# Patient Record
Sex: Male | Born: 1960
Health system: Southern US, Community
[De-identification: ages and names within clinical notes are randomized; demographics above are authoritative.]

## PROBLEM LIST (undated history)

## (undated) DIAGNOSIS — K649 Unspecified hemorrhoids: Secondary | ICD-10-CM

## (undated) DIAGNOSIS — J302 Other seasonal allergic rhinitis: Secondary | ICD-10-CM

## (undated) DIAGNOSIS — C61 Malignant neoplasm of prostate: Secondary | ICD-10-CM

## (undated) DIAGNOSIS — M1990 Unspecified osteoarthritis, unspecified site: Secondary | ICD-10-CM

## (undated) DIAGNOSIS — Z8719 Personal history of other diseases of the digestive system: Secondary | ICD-10-CM

## (undated) DIAGNOSIS — Z8619 Personal history of other infectious and parasitic diseases: Secondary | ICD-10-CM

## (undated) DIAGNOSIS — R972 Elevated prostate specific antigen [PSA]: Secondary | ICD-10-CM

## (undated) DIAGNOSIS — K219 Gastro-esophageal reflux disease without esophagitis: Secondary | ICD-10-CM

## (undated) DIAGNOSIS — B1920 Unspecified viral hepatitis C without hepatic coma: Secondary | ICD-10-CM

## (undated) HISTORY — PX: PROSTATE BIOPSY: SHX241

## (undated) HISTORY — PX: BACK SURGERY: SHX140

## (undated) HISTORY — DX: Elevated prostate specific antigen (PSA): R97.20

## (undated) HISTORY — DX: Unspecified viral hepatitis C without hepatic coma: B19.20

## (undated) HISTORY — PX: FOOT SURGERY: SHX648

---

## 1999-02-17 ENCOUNTER — Ambulatory Visit (HOSPITAL_COMMUNITY): Admission: RE | Admit: 1999-02-17 | Discharge: 1999-02-17 | Payer: Self-pay | Admitting: Gastroenterology

## 1999-10-26 ENCOUNTER — Ambulatory Visit (HOSPITAL_COMMUNITY): Admission: RE | Admit: 1999-10-26 | Discharge: 1999-10-26 | Payer: Self-pay | Admitting: Gastroenterology

## 2000-11-25 ENCOUNTER — Ambulatory Visit (HOSPITAL_COMMUNITY): Admission: RE | Admit: 2000-11-25 | Discharge: 2000-11-25 | Payer: Self-pay

## 2017-09-09 DIAGNOSIS — M25569 Pain in unspecified knee: Secondary | ICD-10-CM | POA: Diagnosis not present

## 2017-09-09 DIAGNOSIS — R1032 Left lower quadrant pain: Secondary | ICD-10-CM | POA: Diagnosis not present

## 2017-09-09 DIAGNOSIS — Z125 Encounter for screening for malignant neoplasm of prostate: Secondary | ICD-10-CM | POA: Diagnosis not present

## 2017-09-09 DIAGNOSIS — Z1331 Encounter for screening for depression: Secondary | ICD-10-CM | POA: Diagnosis not present

## 2017-09-09 DIAGNOSIS — M545 Low back pain: Secondary | ICD-10-CM | POA: Diagnosis not present

## 2017-09-09 DIAGNOSIS — R102 Pelvic and perineal pain: Secondary | ICD-10-CM | POA: Diagnosis not present

## 2017-09-09 DIAGNOSIS — M25561 Pain in right knee: Secondary | ICD-10-CM | POA: Diagnosis not present

## 2017-09-09 DIAGNOSIS — M25562 Pain in left knee: Secondary | ICD-10-CM | POA: Diagnosis not present

## 2017-09-19 ENCOUNTER — Encounter: Payer: Self-pay | Admitting: Gastroenterology

## 2017-09-19 ENCOUNTER — Other Ambulatory Visit: Payer: Self-pay

## 2017-09-23 ENCOUNTER — Ambulatory Visit (INDEPENDENT_AMBULATORY_CARE_PROVIDER_SITE_OTHER): Payer: BLUE CROSS/BLUE SHIELD | Admitting: Gastroenterology

## 2017-09-23 ENCOUNTER — Encounter: Payer: Self-pay | Admitting: Gastroenterology

## 2017-09-23 VITALS — BP 112/70 | HR 78 | Ht 72.0 in | Wt 208.4 lb

## 2017-09-23 DIAGNOSIS — R1032 Left lower quadrant pain: Secondary | ICD-10-CM | POA: Diagnosis not present

## 2017-09-23 DIAGNOSIS — K921 Melena: Secondary | ICD-10-CM | POA: Diagnosis not present

## 2017-09-23 DIAGNOSIS — R634 Abnormal weight loss: Secondary | ICD-10-CM

## 2017-09-23 MED ORDER — GLYCOPYRROLATE 2 MG PO TABS: 2.0000 mg | ORAL_TABLET | Freq: Two times a day (BID) | ORAL | 2 refills | Status: DC

## 2017-09-23 MED ORDER — NA SULFATE-K SULFATE-MG SULF 17.5-3.13-1.6 GM/177ML PO SOLN: 1.0000 | Freq: Once | ORAL | 0 refills | Status: AC

## 2017-09-23 NOTE — Progress Notes (Signed)
History of Present Illness: This is a 57 year old male referred by Charlott Holler, FNP for the evaluation of LLQ pain, L groin pain, hematochezia, thinner stools, weight loss.  Patient relates he underwent colonoscopy by Dr. Earlean Shawl about 20 years ago and polyps were removed.  He recalls a 5-year interval colonoscopy was recommended, however, he has not had a repeat colonoscopy.  Patient also states he was treated for HCV with interferon and ribavirin by Dr. Earlean Shawl about 20 years ago in a study protocol.  For several months he has had mild left lower quadrant pain associated with bowel movements.  The pain is now occurring in his left groin and left lower quadrant and is there constantly it worsens with twisting.  It does not appear to change with bowel movements.  He has noted thinner stools and occasional small amounts of bright red blood per rectum.  He relates a history of hemorrhoids.  He notes about an 8 pound weight loss over the past few weeks.  In addition he has acid reflux which is currently diet controlled.  CBC, CMP, UA from his PCP earlier this month revealed a low platelet count at 76k, an elevated glucose at 122 and otherwise unremarkable.  At the end of the office evaluation he requested a refill of oxycodone.  Denies constipation, diarrhea, melena, nausea, vomiting, dysphagia, chest pain.    Allergies  Allergen Reactions  . Tylenol [Acetaminophen]    Outpatient Medications Prior to Visit  Medication Sig Dispense Refill  . oxycodone (OXY-IR) 5 MG capsule Take 5 mg by mouth 3 (three) times daily as needed.     No facility-administered medications prior to visit.    Past Medical History:  Diagnosis Date  . Hepatitis C         Elevated PSA  Social History   Socioeconomic History  . Marital status: Married    Spouse name: Not on file  . Number of children: Not on file  . Years of education: Not on file  . Highest education level: Not on file  Occupational History  . Not on  file  Social Needs  . Financial resource strain: Not on file  . Food insecurity:    Worry: Not on file    Inability: Not on file  . Transportation needs:    Medical: Not on file    Non-medical: Not on file  Tobacco Use  . Smoking status: Former Research scientist (life sciences)  . Smokeless tobacco: Never Used  Substance and Sexual Activity  . Alcohol use: Not on file  . Drug use: Never  . Sexual activity: Not on file  Lifestyle  . Physical activity:    Days per week: Not on file    Minutes per session: Not on file  . Stress: Not on file  Relationships  . Social connections:    Talks on phone: Not on file    Gets together: Not on file    Attends religious service: Not on file    Active member of club or organization: Not on file    Attends meetings of clubs or organizations: Not on file    Relationship status: Not on file  Other Topics Concern  . Not on file  Social History Narrative  . Not on file   No family history on file.     Review of Systems: Pertinent positive and negative review of systems were noted in the above HPI section. All other review of systems were otherwise negative.  Physical Exam: General: Well developed, well nourished, no acute distress Head: Normocephalic and atraumatic Eyes:  sclerae anicteric, EOMI Ears: Normal auditory acuity Mouth: No deformity or lesions Neck: Supple, no masses or thyromegaly Lungs: Clear throughout to auscultation Heart: Regular rate and rhythm; no murmurs, rubs or bruits Abdomen: Soft, mild tenderness across his lower abdomen, left > right, and non distended. No masses, hepatosplenomegaly or hernias noted. Normal Bowel sounds Rectal: deferred to colonoscopy Musculoskeletal: Symmetrical with no gross deformities  Skin: No lesions on visible extremities Pulses:  Normal pulses noted Extremities: No clubbing, cyanosis, edema or deformities noted Neurological: Alert oriented x 4, grossly nonfocal Cervical Nodes:  No significant cervical  adenopathy Inguinal Nodes: No significant inguinal adenopathy Psychological:  Alert and cooperative. Normal mood and affect   Assessment and Recommendations:  1.  Left lower quadrant pain, left groin pain.  Initially LLQ pain was associated with bowel movements now it is present all the time and it worsens with twisting movements.  Rule out gastrointestinal, musculoskeletal and other than etiologies.  Schedule abdominal/pelvic CT.  Declined to refill oxycodone.  Trial of glycopyrrolate 2 mg twice daily.  2.  Personal history of colon polyps, type unknown, thinner stools, weight loss, small-volume hematochezia.  Rule out colorectal neoplasms.  Schedule colonoscopy.  The risks (including bleeding, perforation, infection, missed lesions, medication reactions and possible hospitalization or surgery if complications occur), benefits, and alternatives to colonoscopy with possible biopsy and possible polypectomy were discussed with the patient and they consent to proceed.   3.  History of hepatitis C.  Thrombocytopenia.   Rule out cirrhosis.  He relates a history of interferon and ribavirin therapy about 20 years ago. Check HCV RNA quant.  Abdominal/pelvic CT as above.  4. GERD.  Follow standard antireflux measures.  Prilosec 20 mg OTC daily as needed.  Tums as needed.   cc: Charlott Holler, MD

## 2017-09-23 NOTE — Patient Instructions (Signed)
Your provider has requested that you go to the basement level for lab work before leaving today. Press "B" on the elevator. The lab is located at the first door on the left as you exit the elevator.  We have sent the following medications to your pharmacy for you to pick up at your convenience:glycopyrrolate.  You have been scheduled for a CT scan of the abdomen and pelvis at [facility] ([address]---this is in the same building as [facility]).   You are scheduled on 10/07/17 at 3:30pm. You should arrive 15 minutes prior to your appointment time for registration. Please follow the written instructions below on the day of your exam:  WARNING: IF YOU ARE ALLERGIC TO IODINE/X-RAY DYE, PLEASE NOTIFY RADIOLOGY IMMEDIATELY AT [phone]! YOU WILL BE GIVEN A 13 HOUR PREMEDICATION PREP.  1) Do not eat or drink anything after 11:30am (4 hours prior to your test) 2) You have been given 2 bottles of oral contrast to drink. The solution may taste better if refrigerated, but do NOT add ice or any other liquid to this solution. Shake well before drinking.    Drink 1 bottle of contrast @ 1:30pm (2 hours prior to your exam)  Drink 1 bottle of contrast @ 2:30pm (1 hour prior to your exam)  You may take any medications as prescribed with a small amount of water except for the following: Metformin, Glucophage, Glucovance, Avandamet, Riomet, Fortamet, Actoplus Met, Janumet, Glumetza or Metaglip. The above medications must be held the day of the exam AND 48 hours after the exam.  The purpose of you drinking the oral contrast is to aid in the visualization of your intestinal tract. The contrast solution may cause some diarrhea. Before your exam is started, you will be given a small amount of fluid to drink. Depending on your individual set of symptoms, you may also receive an intravenous injection of x-ray contrast/dye. Plan on being at [facility] for 30 minutes or longer, depending on  the type of exam you are having performed.  This test typically takes 30-45 minutes to complete.  If you have any questions regarding your exam or if you need to reschedule, you may call the CT department at [phone] between the hours of 8:00 am and 5:00 pm, Monday-Friday.  ________________________________________________________________________  Dennis Bast have been scheduled for a colonoscopy. Please follow written instructions given to you at your visit today.  Please pick up your prep supplies at the pharmacy within the next 1-3 days. If you use inhalers (even only as needed), please bring them with you on the day of your procedure. Your physician has requested that you go to www.startemmi.com and enter the access code given to you at your visit today. This web site gives a general overview about your procedure. However, you should still follow specific instructions given to you by our office regarding your preparation for the procedure.  Thank you for choosing me and [facility].  Pricilla Riffle. Dagoberto Ligas., MD., Marval Regal

## 2017-09-27 ENCOUNTER — Telehealth: Payer: Self-pay

## 2017-09-27 NOTE — Telephone Encounter (Signed)
-----   Message from Darden Dates sent at 09/27/2017 10:17 AM EDT ----- Regarding: Phone call Ileene Musa, I wanted to get a msg to you, probably needs to get to Dr. Fuller Plan.  See below:  Pt called and was very hateful over the telephone returning a call from Friday when he requested benefit information. He said he was sent to [place] for a colonoscopy for preventive services and was told his procedure would be covered. He said he can't afford procedure and said he was told it would be covered. I told him that I had spoke to his insurance directly and gave them all codes and they confirmed that the codes would not fall under preventative services. Again, he said he could not afford it.  I told him he had to be symptom free in order for a colonoscopy to be covered. Then he said that's not what he was told and that he would find another doctor and hung up on me. He called back and again was very hateful. He said he should not have to pay for procedure and that we are "just wanting him to have a bill". I told him that his records would be sent with his claim, and his insurance would know he was having problems.  I also told him that his CT that was scheduled would be a diagnostic service as well.  He said he was told that would be covered too.  I told him that he should call the imaging center to cancel if he wasn't going to keep apt.  He said "you can call to cancel that and the procedure, and I will write a letter to complain about this, this is a disgrace." I told him that I would make a note and have apts cancelled, and then he hung up abruptly again. Note sent to CMA.  This has been put in the referral where I put my notes for benefit information.  I have cancelled procedure and have gotten a msg to [facility] to cancel CT. Thanks, Amy

## 2017-09-27 NOTE — Telephone Encounter (Signed)
Please see Amy's note regarding patient's behavior.

## 2017-10-04 DIAGNOSIS — B182 Chronic viral hepatitis C: Secondary | ICD-10-CM | POA: Diagnosis not present

## 2017-10-04 DIAGNOSIS — Z79899 Other long term (current) drug therapy: Secondary | ICD-10-CM | POA: Diagnosis not present

## 2017-10-04 DIAGNOSIS — M545 Low back pain: Secondary | ICD-10-CM | POA: Diagnosis not present

## 2017-10-04 DIAGNOSIS — R1032 Left lower quadrant pain: Secondary | ICD-10-CM | POA: Diagnosis not present

## 2017-10-06 ENCOUNTER — Encounter: Payer: BLUE CROSS/BLUE SHIELD | Admitting: Gastroenterology

## 2017-10-07 ENCOUNTER — Other Ambulatory Visit: Payer: BLUE CROSS/BLUE SHIELD

## 2017-10-13 DIAGNOSIS — Z1211 Encounter for screening for malignant neoplasm of colon: Secondary | ICD-10-CM | POA: Diagnosis not present

## 2017-10-13 DIAGNOSIS — K648 Other hemorrhoids: Secondary | ICD-10-CM | POA: Insufficient documentation

## 2017-10-26 DIAGNOSIS — K648 Other hemorrhoids: Secondary | ICD-10-CM | POA: Diagnosis not present

## 2017-11-09 DIAGNOSIS — K648 Other hemorrhoids: Secondary | ICD-10-CM | POA: Diagnosis not present

## 2017-11-30 DIAGNOSIS — K648 Other hemorrhoids: Secondary | ICD-10-CM | POA: Diagnosis not present

## 2017-11-30 DIAGNOSIS — R1310 Dysphagia, unspecified: Secondary | ICD-10-CM | POA: Diagnosis not present

## 2017-12-13 DIAGNOSIS — K2960 Other gastritis without bleeding: Secondary | ICD-10-CM | POA: Diagnosis not present

## 2017-12-13 DIAGNOSIS — R1310 Dysphagia, unspecified: Secondary | ICD-10-CM | POA: Diagnosis not present

## 2017-12-13 DIAGNOSIS — R1314 Dysphagia, pharyngoesophageal phase: Secondary | ICD-10-CM | POA: Diagnosis not present

## 2017-12-13 DIAGNOSIS — K2970 Gastritis, unspecified, without bleeding: Secondary | ICD-10-CM | POA: Diagnosis not present

## 2018-01-05 DIAGNOSIS — Z79899 Other long term (current) drug therapy: Secondary | ICD-10-CM | POA: Diagnosis not present

## 2018-01-05 DIAGNOSIS — Z6830 Body mass index (BMI) 30.0-30.9, adult: Secondary | ICD-10-CM | POA: Diagnosis not present

## 2018-01-05 DIAGNOSIS — M545 Low back pain: Secondary | ICD-10-CM | POA: Diagnosis not present

## 2018-02-23 DIAGNOSIS — Z6829 Body mass index (BMI) 29.0-29.9, adult: Secondary | ICD-10-CM | POA: Diagnosis not present

## 2018-02-23 DIAGNOSIS — Z23 Encounter for immunization: Secondary | ICD-10-CM | POA: Diagnosis not present

## 2018-02-23 DIAGNOSIS — R062 Wheezing: Secondary | ICD-10-CM | POA: Diagnosis not present

## 2018-02-23 DIAGNOSIS — Z8619 Personal history of other infectious and parasitic diseases: Secondary | ICD-10-CM | POA: Diagnosis not present

## 2018-02-23 DIAGNOSIS — J209 Acute bronchitis, unspecified: Secondary | ICD-10-CM | POA: Diagnosis not present

## 2018-02-23 DIAGNOSIS — Z0000 Encounter for general adult medical examination without abnormal findings: Secondary | ICD-10-CM | POA: Diagnosis not present

## 2018-02-23 DIAGNOSIS — R0602 Shortness of breath: Secondary | ICD-10-CM | POA: Diagnosis not present

## 2018-03-27 DIAGNOSIS — Z6830 Body mass index (BMI) 30.0-30.9, adult: Secondary | ICD-10-CM | POA: Diagnosis not present

## 2018-03-27 DIAGNOSIS — J209 Acute bronchitis, unspecified: Secondary | ICD-10-CM | POA: Diagnosis not present

## 2018-04-05 DIAGNOSIS — R972 Elevated prostate specific antigen [PSA]: Secondary | ICD-10-CM | POA: Diagnosis not present

## 2018-04-24 DIAGNOSIS — Z6829 Body mass index (BMI) 29.0-29.9, adult: Secondary | ICD-10-CM | POA: Diagnosis not present

## 2018-04-24 DIAGNOSIS — Z79899 Other long term (current) drug therapy: Secondary | ICD-10-CM | POA: Diagnosis not present

## 2018-04-24 DIAGNOSIS — M545 Low back pain: Secondary | ICD-10-CM | POA: Diagnosis not present

## 2018-05-25 DIAGNOSIS — R972 Elevated prostate specific antigen [PSA]: Secondary | ICD-10-CM | POA: Diagnosis not present

## 2018-05-25 DIAGNOSIS — N401 Enlarged prostate with lower urinary tract symptoms: Secondary | ICD-10-CM | POA: Diagnosis not present

## 2018-05-25 DIAGNOSIS — R351 Nocturia: Secondary | ICD-10-CM | POA: Diagnosis not present

## 2018-05-29 DIAGNOSIS — M1712 Unilateral primary osteoarthritis, left knee: Secondary | ICD-10-CM | POA: Diagnosis not present

## 2018-05-29 DIAGNOSIS — M545 Low back pain: Secondary | ICD-10-CM | POA: Diagnosis not present

## 2018-05-29 DIAGNOSIS — Z6830 Body mass index (BMI) 30.0-30.9, adult: Secondary | ICD-10-CM | POA: Diagnosis not present

## 2018-06-30 DIAGNOSIS — Z6830 Body mass index (BMI) 30.0-30.9, adult: Secondary | ICD-10-CM | POA: Diagnosis not present

## 2018-06-30 DIAGNOSIS — M545 Low back pain: Secondary | ICD-10-CM | POA: Diagnosis not present

## 2018-06-30 DIAGNOSIS — M1711 Unilateral primary osteoarthritis, right knee: Secondary | ICD-10-CM | POA: Diagnosis not present

## 2018-07-20 DIAGNOSIS — N401 Enlarged prostate with lower urinary tract symptoms: Secondary | ICD-10-CM | POA: Diagnosis not present

## 2018-07-20 DIAGNOSIS — R351 Nocturia: Secondary | ICD-10-CM | POA: Diagnosis not present

## 2018-07-20 DIAGNOSIS — C61 Malignant neoplasm of prostate: Secondary | ICD-10-CM | POA: Diagnosis not present

## 2018-07-20 DIAGNOSIS — R972 Elevated prostate specific antigen [PSA]: Secondary | ICD-10-CM | POA: Diagnosis not present

## 2018-08-02 ENCOUNTER — Other Ambulatory Visit: Payer: Self-pay

## 2018-09-06 ENCOUNTER — Encounter: Payer: Self-pay | Admitting: Radiation Oncology

## 2018-09-06 NOTE — Progress Notes (Signed)
GU Location of Tumor / Histology: prostatic adenocarcinoma  If Prostate Cancer, Gleason Score is (3 + 4) and PSA is (6.1) on 04/2018. Prostate volume: 26 g.  Patrick Bauer was referred by Charlott Holler, NP to Dr. Jeffie Pollock for further evaluation of an elevated PSA and LUTS.  Biopsies of prostate (if applicable) revealed:    Past/Anticipated interventions by urology, if any: prostate biopsy, referral for consideration of radiation therapy  Past/Anticipated interventions by medical oncology, if any: no  Weight changes, if any: No  Bowel/Bladder complaints, if any: Reports urinary urgency, hesitancy, variable stream, nocturia every hour, sensation of not emptying his bladder completely, and rare dysuria. Denies hematuria.    Nausea/Vomiting, if any: No  Pain issues, if any:  No  SAFETY ISSUES:  Prior radiation? No  Pacemaker/ICD? No  Possible current pregnancy? no, male patient  Is the patient on methotrexate? No  Current Complaints / other details:  58 year old male. Married with five children. Consumes a gallon of fluids per day plus "a lot of coffee." Father with hx of prostate ca.

## 2018-09-08 ENCOUNTER — Encounter: Payer: Self-pay | Admitting: Radiation Oncology

## 2018-09-08 ENCOUNTER — Ambulatory Visit
Admission: RE | Admit: 2018-09-08 | Discharge: 2018-09-08 | Disposition: A | Discharge status: Home / Self Care | Payer: BLUE CROSS/BLUE SHIELD | Source: Ambulatory Visit | Attending: Radiation Oncology | Admitting: Radiation Oncology

## 2018-09-08 ENCOUNTER — Other Ambulatory Visit: Payer: Self-pay

## 2018-09-08 VITALS — Ht 72.0 in | Wt 215.0 lb

## 2018-09-08 DIAGNOSIS — C61 Malignant neoplasm of prostate: Secondary | ICD-10-CM

## 2018-09-08 DIAGNOSIS — R972 Elevated prostate specific antigen [PSA]: Secondary | ICD-10-CM | POA: Diagnosis not present

## 2018-09-08 HISTORY — DX: Malignant neoplasm of prostate: C61

## 2018-09-08 NOTE — Progress Notes (Signed)
Radiation Oncology         [phone] ________________________________  Initial Outpatient Consultation - Conducted via telephone due to current COVID-19 concerns for limiting patient exposure  Name: Patrick Bauer MRN: [id number]  Date: 09/08/2018  DOB: 08/30/1960  CC:Lam, Patrick Rummage, NP  Patrick Seal, MD   REFERRING PHYSICIAN: Irine Seal, MD  DIAGNOSIS: 58 y.o. gentleman with Stage T1c adenocarcinoma of the prostate with Gleason score of 3+4, and PSA of 6.1.    ICD-10-CM   1. Prostate cancer (Patrick Bauer) C61     HISTORY OF PRESENT ILLNESS: Patrick Bauer is a 58 y.o. male with a diagnosis of prostate cancer. He was noted to have an elevated PSA of 6.1 in 04/2018 by his primary care physician, Patrick Holler, NP.  Previous PSA had been elevated at 4.4 in 08/2017 and 6.0 in 01/2018.  Accordingly, he was referred for evaluation in urology by Dr. Jeffie Bauer on 05/25/2018,  digital rectal examination was performed at that time revealing no nodules.  The patient proceeded to transrectal ultrasound with 12 biopsies of the prostate on 07/20/2018.  The prostate volume measured 26 cc.  Out of 12 core biopsies, 3 were positive.  The maximum Gleason score was 3+4, and this was seen in right mid and right apex lateral. Gleason 3+3 was seen in right apex.  The patient reviewed the biopsy results with his urologist and he has kindly been referred today for discussion of potential radiation treatment options.  PREVIOUS RADIATION THERAPY: No  PAST MEDICAL HISTORY:  Past Medical History:  Diagnosis Date  . Elevated PSA   . Hepatitis C   . Prostate cancer ([place])       PAST SURGICAL HISTORY: Past Surgical History:  Procedure Laterality Date  . BACK SURGERY     tumor removed  . FOOT SURGERY Right   . PROSTATE BIOPSY      FAMILY HISTORY:  Family History  Problem Relation Age of Onset  . Non-Hodgkin's lymphoma Father   . Prostate cancer Father     SOCIAL HISTORY:  Social History   Socioeconomic History  .  Marital status: Married    Spouse name: Not on file  . Number of children: 5  . Years of education: Not on file  . Highest education level: Not on file  Occupational History  . Occupation: Furniture conservator/restorer  Social Needs  . Financial resource strain: Not on file  . Food insecurity:    Worry: Not on file    Inability: Not on file  . Transportation needs:    Medical: No    Non-medical: No  Tobacco Use  . Smoking status: Former Smoker    Last attempt to quit: 09/24/1987    Years since quitting: 30.9  . Smokeless tobacco: Never Used  Substance and Sexual Activity  . Alcohol use: Never    Frequency: Never  . Drug use: Never  . Sexual activity: Yes    Partners: Female  Lifestyle  . Physical activity:    Days per week: Not on file    Minutes per session: Not on file  . Stress: Not on file  Relationships  . Social connections:    Talks on phone: Not on file    Gets together: Not on file    Attends religious service: Not on file    Active member of club or organization: Not on file    Attends meetings of clubs or organizations: Not on file    Relationship status: Not on file  .  Intimate partner violence:    Fear of current or ex partner: Not on file    Emotionally abused: Not on file    Physically abused: Not on file    Forced sexual activity: Not on file  Other Topics Concern  . Not on file  Social History Narrative  . Not on file    ALLERGIES: Bee venom and Tylenol [acetaminophen]  MEDICATIONS:  Current Outpatient Medications  Medication Sig Dispense Refill  . Multiple Vitamins-Minerals (MULTIVITAMIN ADULT PO) Take by mouth.    . oxyCODONE (OXY IR/ROXICODONE) 5 MG immediate release tablet TAKE 1 TABLET BY MOUTH 3 TIMES DAILY AS NEEDED FOR BACK PAIN    . Saw Palmetto 1000 MG CAPS Take by mouth.    Marland Kitchen glycopyrrolate (ROBINUL) 2 MG tablet Take 1 tablet (2 mg total) by mouth 2 (two) times daily. (Patient not taking: Reported on 09/08/2018) 60 tablet 2   No current  facility-administered medications for this encounter.     REVIEW OF SYSTEMS:  On review of systems, the patient reports that he is doing well overall. He has increased daytime frequency, urgency and nocturia 4-5x/night which he attributes to the high volume of fluids that he drinks throughout the day and right up to bed time.  He reports drinking at least 1 pot of coffee daily.  He denies weak stream, straining to void, dysuria, gross hematuria or feelings of incomplete bladder emptying.  He denies any chest pain, shortness of breath, cough, fevers, chills, night sweats, unintended weight changes. He denies any bowel disturbances, and denies abdominal pain, nausea or vomiting. He denies any new musculoskeletal or joint aches or pains. He reports urinary urgency, hesitancy, variable stream, nocturia every hour, the sensation of not emptying his bladder completely, and rare dysuria. He denies hematuria. A complete review of systems is obtained and is otherwise negative.    PHYSICAL EXAM:  Wt Readings from Last 3 Encounters:  09/08/18 215 lb (97.5 kg)  09/23/17 208 lb 6 oz (94.5 kg)   Temp Readings from Last 3 Encounters:  No data found for Temp   BP Readings from Last 3 Encounters:  09/23/17 112/70   Pulse Readings from Last 3 Encounters:  09/23/17 78   Unable to assess due to telephone consult encounter format.  KPS = 80  100 - Normal; no complaints; no evidence of disease. 90   - Able to carry on normal activity; minor signs or symptoms of disease. 80   - Normal activity with effort; some signs or symptoms of disease. 62   - Cares for self; unable to carry on normal activity or to do active work. 60   - Requires occasional assistance, but is able to care for most of his personal needs. 50   - Requires considerable assistance and frequent medical care. 17   - Disabled; requires special care and assistance. 7   - Severely disabled; hospital admission is indicated although death not  imminent. 21   - Very sick; hospital admission necessary; active supportive treatment necessary. 10   - Moribund; fatal processes progressing rapidly. 0     - Dead  Karnofsky DA, Abelmann WH, Craver LS and Burchenal JH [phone]) The use of the nitrogen mustards in the palliative treatment of carcinoma: with particular reference to bronchogenic carcinoma Cancer 1 634-56  LABORATORY DATA:  No results found for: WBC, HGB, HCT, MCV, PLT No results found for: NA, K, CL, CO2 No results found for: ALT, AST, GGT, ALKPHOS, BILITOT  RADIOGRAPHY: No results found.    IMPRESSION/PLAN:  This visit was conducted via telephone to spare the patient unnecessary potential exposure in the healthcare setting during the current COVID-19 pandemic.  1. 58 y.o. gentleman with Stage T1c adenocarcinoma of the prostate with Gleason Score of 3+4, and PSA of 6.1. We discussed the patient's workup and outlined the nature of prostate cancer in this setting. The patient's T stage, Gleason's score, and PSA put him into the favorable intermediate risk group. Accordingly, he is eligible for a variety of potential treatment options including brachytherapy, 5.5 weeks of external radiation or prostatectomy. We discussed the available radiation techniques, and focused on the details and logistics and delivery. We discussed and outlined the risks, benefits, short and long-term effects associated with radiotherapy and compared and contrasted these with prostatectomy. We discussed the role of SpaceOAR in reducing the rectal toxicity associated with radiotherapy. He was encouraged to ask questions that were answered to his stated satisfaction.  At the end of the conversation the patient is interested in moving forward with brachytherapy and use of SpaceOAR to reduce rectal toxicity from radiotherapy.  We will strongly encourage the patient to reduce his volume of fluid intake, namely caffiene following the seed implant to help reduce the  bladder irritation to a manageable level.  We will share our discussion with Dr. Jeffie Bauer and move forward with scheduling his CT [facility] planning appointment in the near future.  The patient will connect with Romie Jumper in our office, who will be working closely with him to coordinate OR scheduling and pre and post procedure appointments, in the near future.  We will contact the pharmaceutical rep to ensure that [place] is available at the time of procedure.  He will have a prostate MRI following his post-seed CT SIM to confirm appropriate distribution of the [place].  Given current concerns for patient exposure during the COVID-19 pandemic, this encounter was conducted via telephone. The patient was notified in advance and was offered a [facility] meeting to allow for face to face communication but unfortunately reported that he did not have the appropriate resources/technology to support such a visit and instead preferred to proceed with telephone consult. The patient has given verbal consent for this type of encounter. The time spent during this encounter was 45 minutes. The attendants for this meeting include Tyler Pita MD, Ashlyn Bruning PA-C, [facility], and patient Jacolby Risby. During the encounter, Tyler Pita MD, Ashlyn Bruning PA-C, and scribe, Wilburn Mylar were located at [facility].  Patient, Ervin Hensley was located at home.    Nicholos Johns, PA-C    Tyler Pita, MD  [facility] Direct Dial: [phone]  Fax: [phone] [email or web address]  Skype  [email or web address]   This document serves as a record of services personally performed by Tyler Pita, MD and Freeman Caldron, PA-C. It was created on their behalf by Wilburn Mylar, a trained medical scribe. The creation of this record is based on the scribe's personal observations and the provider's statements to them. This document has  been checked and approved by the attending provider.

## 2018-09-10 DIAGNOSIS — C61 Malignant neoplasm of prostate: Secondary | ICD-10-CM | POA: Insufficient documentation

## 2018-09-27 ENCOUNTER — Other Ambulatory Visit: Payer: Self-pay | Admitting: Urology

## 2018-09-28 DIAGNOSIS — Z79899 Other long term (current) drug therapy: Secondary | ICD-10-CM | POA: Diagnosis not present

## 2018-09-28 DIAGNOSIS — C61 Malignant neoplasm of prostate: Secondary | ICD-10-CM | POA: Diagnosis not present

## 2018-09-28 DIAGNOSIS — M545 Low back pain: Secondary | ICD-10-CM | POA: Diagnosis not present

## 2018-09-28 DIAGNOSIS — Z6829 Body mass index (BMI) 29.0-29.9, adult: Secondary | ICD-10-CM | POA: Diagnosis not present

## 2018-11-22 ENCOUNTER — Telehealth: Payer: Self-pay | Admitting: *Deleted

## 2018-11-22 NOTE — Telephone Encounter (Signed)
CALLED PATIENT TO REMIND OF PRE-SEED APPTS. FOR 11-23-18, SPOKE WITH PATIENT AND HE IS AWARE OF THESE APPTS. 

## 2018-11-23 ENCOUNTER — Other Ambulatory Visit: Payer: Self-pay

## 2018-11-23 ENCOUNTER — Ambulatory Visit
Admission: RE | Admit: 2018-11-23 | Discharge: 2018-11-23 | Disposition: A | Discharge status: Home / Self Care | Payer: BC Managed Care – PPO | Source: Ambulatory Visit | Attending: Urology | Admitting: Urology

## 2018-11-23 ENCOUNTER — Ambulatory Visit (HOSPITAL_COMMUNITY)
Admission: RE | Admit: 2018-11-23 | Discharge: 2018-11-23 | Disposition: A | Discharge status: Home / Self Care | Payer: BC Managed Care – PPO | Source: Ambulatory Visit | Attending: Urology | Admitting: Urology

## 2018-11-23 ENCOUNTER — Encounter (HOSPITAL_COMMUNITY)
Admission: RE | Admit: 2018-11-23 | Discharge: 2018-11-23 | Disposition: A | Discharge status: Home / Self Care | Payer: BC Managed Care – PPO | Source: Ambulatory Visit | Attending: Urology | Admitting: Urology

## 2018-11-23 ENCOUNTER — Ambulatory Visit
Admission: RE | Admit: 2018-11-23 | Discharge: 2018-11-23 | Disposition: A | Discharge status: Home / Self Care | Payer: BC Managed Care – PPO | Source: Ambulatory Visit | Attending: Radiation Oncology | Admitting: Radiation Oncology

## 2018-11-23 DIAGNOSIS — Z01818 Encounter for other preprocedural examination: Secondary | ICD-10-CM | POA: Diagnosis not present

## 2018-11-23 DIAGNOSIS — C61 Malignant neoplasm of prostate: Secondary | ICD-10-CM | POA: Insufficient documentation

## 2018-11-23 NOTE — Progress Notes (Signed)
  Radiation Oncology         [phone]) [phone] ________________________________  Name: Zykeem Bauserman MRN: [id number]  Date: 11/23/2018  DOB: 09-20-1960  SIMULATION AND TREATMENT PLANNING NOTE PUBIC ARCH STUDY  CC:Lam, Rudi Rummage, NP  Irine Seal, MD  DIAGNOSIS: 58 y.o. gentleman with Stage T1c adenocarcinoma of the prostate with Gleason score of 3+4, and PSA of 6.1     ICD-10-CM   1. Prostate cancer ([place])  C61     COMPLEX SIMULATION:  The patient presented today for evaluation for possible prostate seed implant. He was brought to the radiation planning suite and placed supine on the CT couch. A 3-dimensional image study set was obtained in upload to the planning computer. There, on each axial slice, I contoured the prostate gland. Then, using three-dimensional radiation planning tools I reconstructed the prostate in view of the structures from the transperineal needle pathway to assess for possible pubic arch interference. In doing so, I did not appreciate any pubic arch interference. Also, the patient's prostate volume was estimated based on the drawn structure. The volume was 26 cc.  TRUS was also 26 cc.  Given the pubic arch appearance and prostate volume, patient remains a good candidate to proceed with prostate seed implant. Today, he freely provided informed written consent to proceed.   PLAN: The patient will undergo prostate seed implant.   ________________________________  Sheral Apley. Tammi Klippel, M.D.

## 2018-11-28 DIAGNOSIS — C61 Malignant neoplasm of prostate: Secondary | ICD-10-CM | POA: Diagnosis not present

## 2018-12-04 DIAGNOSIS — H5202 Hypermetropia, left eye: Secondary | ICD-10-CM | POA: Diagnosis not present

## 2018-12-11 ENCOUNTER — Telehealth: Payer: Self-pay | Admitting: *Deleted

## 2018-12-11 ENCOUNTER — Encounter (HOSPITAL_COMMUNITY)
Admission: RE | Admit: 2018-12-11 | Discharge: 2018-12-11 | Disposition: A | Discharge status: Home / Self Care | Payer: BC Managed Care – PPO | Source: Ambulatory Visit | Attending: Urology | Admitting: Urology

## 2018-12-11 ENCOUNTER — Other Ambulatory Visit: Payer: Self-pay

## 2018-12-11 ENCOUNTER — Other Ambulatory Visit (HOSPITAL_COMMUNITY)
Admission: RE | Admit: 2018-12-11 | Discharge: 2018-12-11 | Disposition: A | Discharge status: Home / Self Care | Payer: BC Managed Care – PPO | Source: Ambulatory Visit | Attending: Urology | Admitting: Urology

## 2018-12-11 DIAGNOSIS — Z01812 Encounter for preprocedural laboratory examination: Secondary | ICD-10-CM | POA: Diagnosis not present

## 2018-12-11 DIAGNOSIS — Z20828 Contact with and (suspected) exposure to other viral communicable diseases: Secondary | ICD-10-CM | POA: Diagnosis not present

## 2018-12-11 LAB — CBC
HCT: 43.2 % (ref 39.0–52.0)
Hemoglobin: 13.9 g/dL (ref 13.0–17.0)
MCH: 26.1 pg (ref 26.0–34.0)
MCHC: 32.2 g/dL (ref 30.0–36.0)
MCV: 81.2 fL (ref 80.0–100.0)
Platelets: 209 10*3/uL (ref 150–400)
RBC: 5.32 MIL/uL (ref 4.22–5.81)
RDW: 13.9 % (ref 11.5–15.5)
WBC: 6.3 10*3/uL (ref 4.0–10.5)
nRBC: 0 % (ref 0.0–0.2)

## 2018-12-11 LAB — COMPREHENSIVE METABOLIC PANEL
ALT: 11 U/L (ref 0–44)
AST: 23 U/L (ref 15–41)
Albumin: 4.1 g/dL (ref 3.5–5.0)
Alkaline Phosphatase: 62 U/L (ref 38–126)
Anion gap: 8 (ref 5–15)
BUN: 10 mg/dL (ref 6–20)
CO2: 27 mmol/L (ref 22–32)
Calcium: 9.1 mg/dL (ref 8.9–10.3)
Chloride: 101 mmol/L (ref 98–111)
Creatinine, Ser: 1.13 mg/dL (ref 0.61–1.24)
GFR calc Af Amer: >60 mL/min (ref >60)
GFR calc non Af Amer: >60 mL/min (ref >60)
Glucose, Bld: 241 mg/dL — ABNORMAL HIGH (ref 70–99)
Potassium: 4.3 mmol/L (ref 3.5–5.1)
Sodium: 136 mmol/L (ref 135–145)
Total Bilirubin: 0.7 mg/dL (ref 0.3–1.2)
Total Protein: 8.1 g/dL (ref 6.5–8.1)

## 2018-12-11 LAB — PROTIME-INR
INR: 1 (ref 0.8–1.2)
Prothrombin Time: 13.4 seconds (ref 11.4–15.2)

## 2018-12-11 LAB — SARS CORONAVIRUS 2 (TAT 6-24 HRS): SARS Coronavirus 2: NEGATIVE

## 2018-12-11 LAB — APTT: aPTT: 29 seconds (ref 24–36)

## 2018-12-11 NOTE — Telephone Encounter (Signed)
CALLED PATIENT TO REMIND OF LAB AND COVID- 19 TESTING FOR 12-11-18, VOICE MAIL FULL UNABLE TO LEAVE MESSAGE

## 2018-12-12 ENCOUNTER — Encounter: Payer: Self-pay | Admitting: *Deleted

## 2018-12-13 ENCOUNTER — Telehealth: Payer: Self-pay | Admitting: *Deleted

## 2018-12-13 ENCOUNTER — Other Ambulatory Visit: Payer: Self-pay

## 2018-12-13 ENCOUNTER — Encounter (HOSPITAL_BASED_OUTPATIENT_CLINIC_OR_DEPARTMENT_OTHER): Payer: Self-pay | Admitting: *Deleted

## 2018-12-13 NOTE — Telephone Encounter (Signed)
CALLED PATIENT TO REMIND OF PROCEDURE FOR 12-14-18, NO ANSWER WILL CALL  LATER

## 2018-12-13 NOTE — Progress Notes (Signed)
Spoke w/ pt via phone for pre-op interview.  Npo after mn w/ exception clear liquids until 0600 (no milk/ cream products) then nothing by mouth,  pt verbalized understanding.  Arrive at 1000.  Current lab results and covid test done dated 12-11-2018 in chart and epic.  Current ekg and cxr in chart and epic.  Pt verbalized understanding to do one fleet enema am dos.

## 2018-12-13 NOTE — H&P (Signed)
CC/HPI: Pt presents today for pre-operative history and physical exam in anticipation of brachytherapy and placement of space oar by Dr. Jeffie Pollock on 12/14/18.   Pt denies F/C, HA, CP, SOB, N/V, diarrhea/constipation, back pain, flank pain, hematuria, and dysuria.   HX:     CC: My PSA is elevated above the normal range.  HPI: Patrick Bauer is a 58 year-old male established patient who is here for an elevated PSA.  Patrick Bauer returns for prostate biopsy. He is a 58 yo WM who is sent by Charlott Holler NP for an elevated PSA and LUTS. His PSA was 6.1 in December and he has had 3 prior over the past year that were similar. He has a several year history of LUTS and has been taking Saw Palmetto for several years. He is on another prostate supplement. He has an IPSS of 28. He has urgency and hesitancy. He has a variable stream. He has nocturia q1hr. He drinks a lot of fluids and thinks he drinks about a gallon a day. He drinks a lot of mild. He has intermittency and a sensation of incomplete emptying. He has had rare dysuria but no hematuria. He has no history of prostatitis. He had a stone once when he was about 29. He has had no GU surgery. His father had prostate cancer in his 68's but didn't die of prostate cancer. He has mild ED with reduced tumescence.    His PSA is 6.1. He has had PSA's drawn prior to this one. He has had elevated PSA's prior to this one.     AUA Symptom Score: Almost always he has the sensation of not emptying his bladder completely when finished urinating. Almost always he has to urinate again fewer than two hours after he has finished urinating. 50% of the time he has to start and stop again several times when he urinates. Almost always he finds it difficult to postpone urination. 50% of the time he has a weak urinary stream. Less than 50% of the time he has to push or strain to begin urination. He has to get up to urinate 5 or more times from the time he goes to bed until the time he gets  up in the morning.   Calculated AUA Symptom Score: 28    ALLERGIES: Tylenol - "spit up blood" after taking it a prolonged period of time    MEDICATIONS: Multivitamin  Saw Palmetto     GU PSH: Complex Uroflow - 07/20/2018 Prostate Needle Biopsy - 07/20/2018     NON-GU PSH: Surgical Pathology, Gross And Microscopic Examination For Prostate Needle - 07/20/2018     GU PMH: BPH w/LUTS - 05/25/2018 Elevated PSA, He has an elevated PSA with a small benign prostate on exam and severe LUTS. I am going to have him return for a flowrate and TRUS Bx. I have reviewed the risks of bleeding, infection and voiding difficulty. - 05/25/2018 Nocturia, He drinks a gallon of fluid daily and consumes a lot of coffee. With his LUTS and OAB I have recommended he keep a voiding diary for 48hr prior to return. - 05/25/2018 Prostate Cancer    NON-GU PMH: Arthritis GERD    FAMILY HISTORY: Prostate Cancer - Father   SOCIAL HISTORY: Marital Status: Married Preferred Language: English; Race: White Current Smoking Status: Patient does not smoke anymore.   Tobacco Use Assessment Completed: Used Tobacco in last 30 days? Does not use smokeless tobacco. Types of alcohol consumed: Wine. Social Drinker.  Does  not use drugs. Drinks 4+ caffeinated drinks per day. Has not had a blood transfusion. 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     Notes: Quit smoking 35 years ago   REVIEW OF SYSTEMS:    GU Review Male:   Patient reports hard to postpone urination, get up at night to urinate, and stream starts and stops. Patient denies frequent urination, burning/ pain with urination, trouble starting your stream, have to strain to urinate , erection problems, and penile pain.  Gastrointestinal (Upper):   Patient denies nausea, vomiting, and indigestion/ heartburn.  Gastrointestinal (Lower):   Patient denies diarrhea and constipation.  Constitutional:   Patient denies fever, night sweats, weight loss, and fatigue.   Skin:   Patient denies skin rash/ lesion and itching.  Eyes:   Patient denies blurred vision and double vision.  Ears/ Nose/ Throat:   Patient denies sore throat and sinus problems.  Hematologic/Lymphatic:   Patient denies swollen glands and easy bruising.  Cardiovascular:   Patient denies leg swelling and chest pains.  Respiratory:   Patient denies cough and shortness of breath.  Endocrine:   Patient denies excessive thirst.  Musculoskeletal:   Patient reports back pain. Patient denies joint pain.  Neurological:   Patient denies headaches and dizziness.  Psychologic:   Patient denies depression and anxiety.   VITAL SIGNS:      11/28/2018 02:08 PM  Weight 210 lb / 95.25 kg  Height 72 in / 182.88 cm  BP 130/77 mmHg  Pulse 91 /min  Temperature 97.8 F / 36.5 C  BMI 28.5 kg/m   MULTI-SYSTEM PHYSICAL EXAMINATION:    Constitutional: Well-nourished. No physical deformities. Normally developed. Good grooming.  Neck: Neck symmetrical, not swollen. Normal tracheal position.  Respiratory: Normal breath sounds. No labored breathing, no use of accessory muscles.   Cardiovascular: Regular rate and rhythm. No murmur, no gallop.   Lymphatic: No enlargement of neck, axillae, groin.  Skin: No paleness, no jaundice, no cyanosis. No lesion, no ulcer, no rash.  Neurologic / Psychiatric: Oriented to time, oriented to place, oriented to person. No depression, no anxiety, no agitation.  Gastrointestinal: No mass, no tenderness, no rigidity, non obese abdomen.  Eyes: Normal conjunctivae. Normal eyelids.  Ears, Nose, Mouth, and Throat: Left ear no scars, no lesions, no masses. Right ear no scars, no lesions, no masses. Nose no scars, no lesions, no masses. Normal hearing. Normal lips.  Musculoskeletal: Normal gait and station of head and neck.     PAST DATA REVIEWED:  Source Of History:  Patient  Records Review:   Previous Patient Records  Urine Test Review:   Urinalysis   04/11/18  PSA  Total PSA  6.1 ng/dl    11/28/18  Urinalysis  Urine Appearance Clear   Urine Color Yellow   Urine Glucose Trace mg/dL  Urine Bilirubin Neg mg/dL  Urine Ketones Neg mg/dL  Urine Specific Gravity 1.025   Urine Blood Neg ery/uL  Urine pH 6.0   Urine Protein Neg mg/dL  Urine Urobilinogen 0.2 mg/dL  Urine Nitrites Neg   Urine Leukocyte Esterase Neg leu/uL   PROCEDURES:          Urinalysis - 81003 Dipstick Dipstick Cont'd  Color: Yellow Bilirubin: Neg mg/dL  Appearance: Clear Ketones: Neg mg/dL  Specific Gravity: 1.025 Blood: Neg ery/uL  pH: 6.0 Protein: Neg mg/dL  Glucose: Trace mg/dL Urobilinogen: 0.2 mg/dL    Nitrites: Neg    Leukocyte Esterase: Neg leu/uL    ASSESSMENT:  ICD-10 Details  1 GU:   Prostate Cancer - C61    PLAN:           Schedule Return Visit/Planned Activity: Keep Scheduled Appointment - Schedule Surgery          Document Letter(s):  Created for Patient: Clinical Summary         Notes:   There are no changes in the patients history or physical exam since last evaluation by Dr. Jeffie Pollock. Pt is scheduled to undergo brachytherapy and space oar on 12/14/18.    All pt's questions were answered to the best of my ability.          Next Appointment:      Next Appointment: 12/14/2018 12:00 PM    Appointment Type: Surgery     Location: [facility] [phone]    Provider: Irine Seal, M.D.    Reason for Visit: [place]

## 2018-12-13 NOTE — Telephone Encounter (Signed)
Called patient to remind of procedure for 12-14-18, spoke with patient and he is aware of this procedure

## 2018-12-14 ENCOUNTER — Other Ambulatory Visit: Payer: Self-pay

## 2018-12-14 ENCOUNTER — Ambulatory Visit (HOSPITAL_BASED_OUTPATIENT_CLINIC_OR_DEPARTMENT_OTHER): Payer: BC Managed Care – PPO | Admitting: Physician Assistant

## 2018-12-14 ENCOUNTER — Ambulatory Visit (HOSPITAL_COMMUNITY): Payer: BC Managed Care – PPO

## 2018-12-14 ENCOUNTER — Encounter (HOSPITAL_BASED_OUTPATIENT_CLINIC_OR_DEPARTMENT_OTHER): Payer: Self-pay | Admitting: Emergency Medicine

## 2018-12-14 ENCOUNTER — Encounter (HOSPITAL_BASED_OUTPATIENT_CLINIC_OR_DEPARTMENT_OTHER)
Admission: RE | Disposition: A | Discharge status: Home / Self Care | Payer: Self-pay | Source: Ambulatory Visit | Attending: Urology

## 2018-12-14 ENCOUNTER — Ambulatory Visit (HOSPITAL_BASED_OUTPATIENT_CLINIC_OR_DEPARTMENT_OTHER)
Admission: RE | Admit: 2018-12-14 | Discharge: 2018-12-14 | Disposition: A | Discharge status: Home / Self Care | Payer: BC Managed Care – PPO | Source: Ambulatory Visit | Attending: Urology | Admitting: Urology

## 2018-12-14 ENCOUNTER — Ambulatory Visit (HOSPITAL_BASED_OUTPATIENT_CLINIC_OR_DEPARTMENT_OTHER): Payer: BC Managed Care – PPO | Admitting: Anesthesiology

## 2018-12-14 DIAGNOSIS — C61 Malignant neoplasm of prostate: Secondary | ICD-10-CM | POA: Insufficient documentation

## 2018-12-14 DIAGNOSIS — Z87891 Personal history of nicotine dependence: Secondary | ICD-10-CM | POA: Insufficient documentation

## 2018-12-14 HISTORY — DX: Other seasonal allergic rhinitis: J30.2

## 2018-12-14 HISTORY — DX: Personal history of other diseases of the digestive system: Z87.19

## 2018-12-14 HISTORY — DX: Unspecified hemorrhoids: K64.9

## 2018-12-14 HISTORY — DX: Gastro-esophageal reflux disease without esophagitis: K21.9

## 2018-12-14 HISTORY — DX: Unspecified osteoarthritis, unspecified site: M19.90

## 2018-12-14 HISTORY — PX: RADIOACTIVE SEED IMPLANT: SHX5150

## 2018-12-14 HISTORY — DX: Personal history of other infectious and parasitic diseases: Z86.19

## 2018-12-14 HISTORY — PX: CYSTOSCOPY: SHX5120

## 2018-12-14 SURGERY — INSERTION, RADIATION SOURCE, PROSTATE
Anesthesia: General | Site: Prostate

## 2018-12-14 MED ORDER — OXYCODONE HCL 5 MG PO TABS
5.0000 mg | ORAL_TABLET | Freq: Once | ORAL | Status: AC | PRN
  Administered 2018-12-14: 5 mg via ORAL
  Filled 2018-12-14: qty 1

## 2018-12-14 MED ORDER — OXYCODONE HCL 5 MG PO TABS
5.0000 mg | ORAL_TABLET | ORAL | Status: DC | PRN
  Filled 2018-12-14: qty 2

## 2018-12-14 MED ORDER — CIPROFLOXACIN IN D5W 400 MG/200ML IV SOLN
INTRAVENOUS | Status: AC
  Filled 2018-12-14: qty 200

## 2018-12-14 MED ORDER — FENTANYL CITRATE (PF) 100 MCG/2ML IJ SOLN
25.0000 ug | INTRAMUSCULAR | Status: AC | PRN
  Administered 2018-12-14 (×2): 25 ug via INTRAVENOUS
  Administered 2018-12-14 (×4): 50 ug via INTRAVENOUS
  Filled 2018-12-14: qty 1

## 2018-12-14 MED ORDER — FENTANYL CITRATE (PF) 100 MCG/2ML IJ SOLN
50.0000 ug | Freq: Once | INTRAMUSCULAR | Status: AC
  Administered 2018-12-14: 50 ug via INTRAVENOUS
  Filled 2018-12-14: qty 1

## 2018-12-14 MED ORDER — LIDOCAINE 2% (20 MG/ML) 5 ML SYRINGE
INTRAMUSCULAR | Status: DC | PRN
  Administered 2018-12-14: 100 mg via INTRAVENOUS

## 2018-12-14 MED ORDER — PROPOFOL 10 MG/ML IV BOLUS
INTRAVENOUS | Status: AC
  Filled 2018-12-14: qty 20

## 2018-12-14 MED ORDER — SODIUM CHLORIDE 0.9% FLUSH
3.0000 mL | INTRAVENOUS | Status: DC | PRN
  Filled 2018-12-14: qty 3

## 2018-12-14 MED ORDER — ONDANSETRON HCL 4 MG/2ML IJ SOLN
INTRAMUSCULAR | Status: AC
  Filled 2018-12-14: qty 2

## 2018-12-14 MED ORDER — OXYCODONE HCL 5 MG PO TABS
ORAL_TABLET | ORAL | Status: AC
  Filled 2018-12-14: qty 1

## 2018-12-14 MED ORDER — FENTANYL CITRATE (PF) 100 MCG/2ML IJ SOLN
INTRAMUSCULAR | Status: AC
  Filled 2018-12-14: qty 2

## 2018-12-14 MED ORDER — MIDAZOLAM HCL 2 MG/2ML IJ SOLN
INTRAMUSCULAR | Status: AC
  Filled 2018-12-14: qty 2

## 2018-12-14 MED ORDER — DEXAMETHASONE SODIUM PHOSPHATE 10 MG/ML IJ SOLN
INTRAMUSCULAR | Status: AC
  Filled 2018-12-14: qty 1

## 2018-12-14 MED ORDER — PROPOFOL 10 MG/ML IV BOLUS
INTRAVENOUS | Status: DC | PRN
  Administered 2018-12-14: 50 mg via INTRAVENOUS
  Administered 2018-12-14: 30 mg via INTRAVENOUS
  Administered 2018-12-14: 200 mg via INTRAVENOUS

## 2018-12-14 MED ORDER — MEPERIDINE HCL 25 MG/ML IJ SOLN
6.2500 mg | INTRAMUSCULAR | Status: DC | PRN
  Filled 2018-12-14: qty 1

## 2018-12-14 MED ORDER — SODIUM CHLORIDE 0.9% FLUSH
3.0000 mL | Freq: Two times a day (BID) | INTRAVENOUS | Status: DC
  Filled 2018-12-14: qty 3

## 2018-12-14 MED ORDER — KETOROLAC TROMETHAMINE 30 MG/ML IJ SOLN
INTRAMUSCULAR | Status: AC
  Filled 2018-12-14: qty 1

## 2018-12-14 MED ORDER — SODIUM CHLORIDE 0.9 % IR SOLN
Status: DC | PRN
  Administered 2018-12-14: 1000 mL via INTRAVESICAL

## 2018-12-14 MED ORDER — SODIUM CHLORIDE 0.9 % IV SOLN
250.0000 mL | INTRAVENOUS | Status: DC | PRN
  Filled 2018-12-14: qty 250

## 2018-12-14 MED ORDER — LIDOCAINE 2% (20 MG/ML) 5 ML SYRINGE
INTRAMUSCULAR | Status: AC
  Filled 2018-12-14: qty 5

## 2018-12-14 MED ORDER — IBUPROFEN 200 MG PO TABS
200.0000 mg | ORAL_TABLET | Freq: Four times a day (QID) | ORAL | Status: DC | PRN
  Filled 2018-12-14: qty 2

## 2018-12-14 MED ORDER — FENTANYL CITRATE (PF) 100 MCG/2ML IJ SOLN
100.0000 ug | Freq: Once | INTRAMUSCULAR | Status: AC
  Administered 2018-12-14: 50 ug via INTRAVENOUS
  Filled 2018-12-14: qty 2

## 2018-12-14 MED ORDER — IOHEXOL 300 MG/ML  SOLN
INTRAMUSCULAR | Status: DC | PRN
  Administered 2018-12-14: 7 mL

## 2018-12-14 MED ORDER — ONDANSETRON HCL 4 MG/2ML IJ SOLN
4.0000 mg | Freq: Once | INTRAMUSCULAR | Status: DC | PRN
  Filled 2018-12-14: qty 2

## 2018-12-14 MED ORDER — DEXAMETHASONE SODIUM PHOSPHATE 10 MG/ML IJ SOLN
INTRAMUSCULAR | Status: DC | PRN
  Administered 2018-12-14: 10 mg via INTRAVENOUS

## 2018-12-14 MED ORDER — MORPHINE SULFATE (PF) 2 MG/ML IV SOLN
INTRAVENOUS | Status: AC
  Filled 2018-12-14: qty 1

## 2018-12-14 MED ORDER — STERILE WATER FOR INJECTION IJ SOLN
INTRAMUSCULAR | Status: DC | PRN
  Administered 2018-12-14: 13:00:00 3 mL

## 2018-12-14 MED ORDER — SODIUM CHLORIDE (PF) 0.9 % IJ SOLN
INTRAMUSCULAR | Status: DC | PRN
  Administered 2018-12-14: 10 mL

## 2018-12-14 MED ORDER — OXYCODONE HCL 5 MG/5ML PO SOLN
5.0000 mg | Freq: Once | ORAL | Status: AC | PRN
  Filled 2018-12-14: qty 5

## 2018-12-14 MED ORDER — IBUPROFEN 100 MG/5ML PO SUSP
200.0000 mg | Freq: Four times a day (QID) | ORAL | Status: DC | PRN
  Filled 2018-12-14: qty 30

## 2018-12-14 MED ORDER — LACTATED RINGERS IV SOLN
INTRAVENOUS | Status: DC
  Administered 2018-12-14: 10:00:00 via INTRAVENOUS
  Administered 2018-12-14: 1000 mL via INTRAVENOUS
  Filled 2018-12-14: qty 1000

## 2018-12-14 MED ORDER — KETOROLAC TROMETHAMINE 30 MG/ML IJ SOLN
30.0000 mg | Freq: Once | INTRAMUSCULAR | Status: AC | PRN
  Administered 2018-12-14: 30 mg via INTRAVENOUS
  Filled 2018-12-14: qty 1

## 2018-12-14 MED ORDER — IBUPROFEN 400 MG PO TABS
400.0000 mg | ORAL_TABLET | ORAL | Status: DC | PRN
  Filled 2018-12-14: qty 1

## 2018-12-14 MED ORDER — CIPROFLOXACIN IN D5W 400 MG/200ML IV SOLN
400.0000 mg | INTRAVENOUS | Status: AC
  Administered 2018-12-14: 400 mg via INTRAVENOUS
  Filled 2018-12-14: qty 200

## 2018-12-14 MED ORDER — MORPHINE SULFATE (PF) 2 MG/ML IV SOLN
2.0000 mg | INTRAVENOUS | Status: DC | PRN
  Administered 2018-12-14: 2 mg via INTRAVENOUS
  Filled 2018-12-14: qty 1

## 2018-12-14 MED ORDER — FLEET ENEMA 7-19 GM/118ML RE ENEM
1.0000 | ENEMA | Freq: Once | RECTAL | Status: DC
  Filled 2018-12-14: qty 1

## 2018-12-14 SURGICAL SUPPLY — 44 items
BAG URINE DRAINAGE (UROLOGICAL SUPPLIES) ×3 IMPLANT
BLADE CLIPPER SENSICLIP SURGIC (BLADE) ×3 IMPLANT
CATH FOLEY 2WAY SLVR  5CC 16FR (CATHETERS) ×1
CATH FOLEY 2WAY SLVR 5CC 16FR (CATHETERS) ×2 IMPLANT
CATH ROBINSON RED A/P 16FR (CATHETERS) IMPLANT
CATH ROBINSON RED A/P 20FR (CATHETERS) ×3 IMPLANT
CLOTH BEACON ORANGE TIMEOUT ST (SAFETY) ×3 IMPLANT
CONT SPECI 4OZ STER CLIK (MISCELLANEOUS) ×6 IMPLANT
COVER BACK TABLE 60X90IN (DRAPES) ×3 IMPLANT
COVER MAYO STAND STRL (DRAPES) ×3 IMPLANT
COVER WAND RF STERILE (DRAPES) IMPLANT
DRSG TEGADERM 4X4.75 (GAUZE/BANDAGES/DRESSINGS) ×3 IMPLANT
DRSG TEGADERM 8X12 (GAUZE/BANDAGES/DRESSINGS) ×3 IMPLANT
GAUZE SPONGE 4X4 12PLY STRL LF (GAUZE/BANDAGES/DRESSINGS) ×3 IMPLANT
GLOVE BIO SURGEON STRL SZ 6 (GLOVE) IMPLANT
GLOVE BIO SURGEON STRL SZ 6.5 (GLOVE) IMPLANT
GLOVE BIO SURGEON STRL SZ7 (GLOVE) IMPLANT
GLOVE BIO SURGEON STRL SZ8 (GLOVE) IMPLANT
GLOVE BIOGEL PI IND STRL 6 (GLOVE) IMPLANT
GLOVE BIOGEL PI IND STRL 6.5 (GLOVE) IMPLANT
GLOVE BIOGEL PI IND STRL 7.0 (GLOVE) IMPLANT
GLOVE BIOGEL PI IND STRL 8 (GLOVE) IMPLANT
GLOVE BIOGEL PI INDICATOR 6 (GLOVE)
GLOVE BIOGEL PI INDICATOR 6.5 (GLOVE)
GLOVE BIOGEL PI INDICATOR 7.0 (GLOVE)
GLOVE BIOGEL PI INDICATOR 8 (GLOVE)
GLOVE SURG ORTHO 8.5 STRL (GLOVE) IMPLANT
GLOVE SURG SS PI 8.0 STRL IVOR (GLOVE) ×6 IMPLANT
GOWN STRL REUS W/ TWL XL LVL3 (GOWN DISPOSABLE) ×2 IMPLANT
GOWN STRL REUS W/TWL XL LVL3 (GOWN DISPOSABLE) ×1
HOLDER FOLEY CATH W/STRAP (MISCELLANEOUS) IMPLANT
I-Seed  AgX100 ×3 IMPLANT
IMPL SPACEOAR SYSTEM 10ML (Spacer) ×2 IMPLANT
IMPLANT SPACEOAR SYSTEM 10ML (Spacer) ×3 IMPLANT
IV NS 1000ML (IV SOLUTION) ×1
IV NS 1000ML BAXH (IV SOLUTION) ×2 IMPLANT
KIT TURNOVER CYSTO (KITS) ×3 IMPLANT
MARKER SKIN DUAL TIP RULER LAB (MISCELLANEOUS) ×3 IMPLANT
PACK CYSTO (CUSTOM PROCEDURE TRAY) ×3 IMPLANT
SUT BONE WAX W31G (SUTURE) IMPLANT
SYR 10ML LL (SYRINGE) IMPLANT
UNDERPAD 30X30 (UNDERPADS AND DIAPERS) ×6 IMPLANT
WATER STERILE IRR 3000ML UROMA (IV SOLUTION) IMPLANT
WATER STERILE IRR 500ML POUR (IV SOLUTION) ×3 IMPLANT

## 2018-12-14 NOTE — Transfer of Care (Signed)
Immediate Anesthesia Transfer of Care Note  Patient: Patrick Bauer  Procedure(s) Performed: RADIOACTIVE SEED IMPLANT/BRACHYTHERAPY IMPLANT (N/A Prostate) CYSTOSCOPY (N/A Bladder)  Patient Location: PACU  Anesthesia Type:General  Level of Consciousness: awake, alert  and oriented  Airway & Oxygen Therapy: Patient Spontanous Breathing and Patient connected to nasal cannula oxygen  Post-op Assessment: Report given to RN  Post vital signs: Reviewed and stable  Last Vitals: 125/94, 73, 18, 100% Vitals Value Taken Time  BP    Temp    Pulse    Resp    SpO2      Last Pain:  Vitals:   12/14/18 1002  TempSrc: Oral  PainSc: 0-No pain      Patients Stated Pain Goal: 4 (12/81/18 8677)  Complications: No apparent anesthesia complications

## 2018-12-14 NOTE — Interval H&P Note (Signed)
History and Physical Interval Note:  12/14/2018 11:59 AM  Patrick Bauer  has presented today for surgery, with the diagnosis of PROSTATE CANCER.  The various methods of treatment have been discussed with the patient and family. After consideration of risks, benefits and other options for treatment, the patient has consented to  Procedure(s): RADIOACTIVE SEED IMPLANT/BRACHYTHERAPY IMPLANT (N/A) as a surgical intervention.  The patient's history has been reviewed, patient examined, no change in status, stable for surgery.  I have reviewed the patient's chart and labs.  Questions were answered to the patient's satisfaction.     Irine Seal

## 2018-12-14 NOTE — Anesthesia Postprocedure Evaluation (Signed)
Anesthesia Post Note  Patient: Patrick Bauer  Procedure(s) Performed: RADIOACTIVE SEED IMPLANT/BRACHYTHERAPY IMPLANT (N/A Prostate) CYSTOSCOPY (N/A Bladder)     Patient location during evaluation: PACU Anesthesia Type: General Level of consciousness: awake Pain management: pain level controlled Vital Signs Assessment: post-procedure vital signs reviewed and stable Respiratory status: spontaneous breathing Cardiovascular status: stable Postop Assessment: no apparent nausea or vomiting Anesthetic complications: no    Last Vitals:  Vitals:   12/14/18 1500 12/14/18 1504  BP: (!) 121/95 124/85  Pulse: 69 72  Resp: 13 14  Temp:    SpO2: 98% 97%    Last Pain:  Vitals:   12/14/18 1502  TempSrc:   PainSc: 5    Pain Goal: Patients Stated Pain Goal: 7 (12/14/18 1502)                 Huston Foley

## 2018-12-14 NOTE — Op Note (Signed)
PATIENT:  Patrick Bauer  PRE-OPERATIVE DIAGNOSIS:  Adenocarcinoma of the prostate  POST-OPERATIVE DIAGNOSIS:  Same  PROCEDURE:  Procedure(s): 1. I-125 radioactive seed implantation 2. Cystoscopy  SURGEON:  Surgeon(s): Irine Seal MD  Radiation oncologist: Dr. Tyler Pita  ANESTHESIA:  General  EBL:  Minimal  DRAINS: none.  INDICATION: [name] is a 58 y.o. with Stage T1c, Gleason 7(3+4) prostate cancer who has elected brachytherapy for treatment.  Description of procedure: After informed consent the patient was brought to the major OR, placed on the table and administered general anesthesia. He was then moved to the modified lithotomy position with his perineum perpendicular to the floor. His perineum and genitalia were then sterilely prepped. An official timeout was then performed. A 16 French Foley catheter was then placed in the bladder and filled with dilute contrast, a rectal tube was placed in the rectum and the transrectal ultrasound probe was placed in the rectum and affixed to the stand. He was then sterilely draped.  The sterile grid was installed.   Anchor needles were then placed.   Real time ultrasonography was used along with the seed planning software spot-pro version 3.1-00. This was used to develop the seed plan including the number of needles as well as number of seeds required for complete and adequate coverage. Real-time ultrasonography was then used along with the previously developed plan to implant a total of 72 seeds using 30 needles for a target dose of 145 Gy. This proceeded without difficulty or complication.  The stabilization needles and guide were removed and the SpaceOAR needle was passed into the posterior fat stripe in the midline to the mid prostate.  A puff of saline confirmed good positioning and the SpaceOAR polymer was then injected over 10sec with excellent distribution and positioning.   The Foley catheter was then removed as well as the  transrectal ultrasound probe and rectal probe. Flexible cystoscopy was then performed using the 17 French flexible scope which revealed a normal urethra throughout its length down to the sphincter which appeared intact. The prostatic urethra was approximately 3cm with bilobar hyperplasia with some coaptation. The bladder was then entered and fully and systematically inspected.  There was mild trabeculation.   The ureteral orifices were noted to be of normal configuration and position. The mucosa revealed no evidence of tumors. There were also no stones identified within the bladder.  No seeds or spacers were seen and/or removed from the bladder.  The cystoscope was then removed.  The drapes were removed.  The perineum was cleaned and dressed.  He was taken out of the lithotomy position and was awakened and taken to recovery room in stable and satisfactory condition. He tolerated procedure well and there were no intraoperative complications.

## 2018-12-14 NOTE — Anesthesia Procedure Notes (Signed)
Procedure Name: LMA Insertion Date/Time: 12/14/2018 12:30 PM Performed by: Bonney Aid, CRNA Pre-anesthesia Checklist: Patient identified, Emergency Drugs available, Suction available and Patient being monitored Patient Re-evaluated:Patient Re-evaluated prior to induction Oxygen Delivery Method: Circle system utilized Preoxygenation: Pre-oxygenation with 100% oxygen Induction Type: IV induction Ventilation: Mask ventilation without difficulty LMA: LMA inserted LMA Size: 4.0 Number of attempts: 1 Airway Equipment and Method: Bite block Placement Confirmation: positive ETCO2 Tube secured with: Tape Dental Injury: Teeth and Oropharynx as per pre-operative assessment

## 2018-12-14 NOTE — Anesthesia Preprocedure Evaluation (Signed)
Anesthesia Evaluation  Patient identified by MRN, date of birth, ID band Patient awake    Reviewed: Allergy & Precautions, NPO status , Patient's Chart, lab work & pertinent test results  Airway Mallampati: I       Dental no notable dental hx. (+) Teeth Intact   Pulmonary former smoker,    Pulmonary exam normal breath sounds clear to auscultation       Cardiovascular Normal cardiovascular exam Rhythm:Regular Rate:Normal     Neuro/Psych negative neurological ROS  negative psych ROS   GI/Hepatic   Endo/Other    Renal/GU negative Renal ROS     Musculoskeletal   Abdominal Normal abdominal exam  (+)   Peds  Hematology negative hematology ROS (+)   Anesthesia Other Findings   Reproductive/Obstetrics                             Anesthesia Physical Anesthesia Plan  ASA: II  Anesthesia Plan: General   Post-op Pain Management:    Induction: Intravenous  PONV Risk Score and Plan: 3 and Ondansetron, Dexamethasone and Midazolam  Airway Management Planned: LMA  Additional Equipment:   Intra-op Plan:   Post-operative Plan:   Informed Consent: I have reviewed the patients History and Physical, chart, labs and discussed the procedure including the risks, benefits and alternatives for the proposed anesthesia with the patient or authorized representative who has indicated his/her understanding and acceptance.     Dental advisory given  Plan Discussed with: CRNA  Anesthesia Plan Comments:         Anesthesia Quick Evaluation

## 2018-12-14 NOTE — Discharge Instructions (Addendum)
Brachytherapy for Prostate Cancer, Care After ° °This sheet gives you information about how to care for yourself after your procedure. Your health care provider may also give you more specific instructions. If you have problems or questions, contact your health care provider. °What can I expect after the procedure? °After the procedure, it is common to have: °· Trouble passing urine. °· Blood in the urine or semen. °· Constipation. °· Frequent feeling of an urgent need to urinate. °· Bruising, swelling, and tenderness of the area behind the scrotum (perineum). °· Bloating and gas. °· Fatigue. °· Burning or pain in the rectum. °· Problems getting or keeping an erection (erectile dysfunction). °· Nausea. °Follow these instructions at home: °Managing pain, stiffness, and swelling °· If directed, apply ice to the affected area: °? Put ice in a plastic bag. °? Place a towel between your skin and the bag. °? Leave the ice on for 20 minutes, 2-3 times a day. °· Try not to sit directly on the area behind the scrotum. A soft cushion can help with discomfort. °Activity °· Do not drive for 24 hours if you were given a medicine to help you relax (sedative). °· Do not drive or use heavy machinery while taking prescription pain medicine. °· Rest as told by your health care provider. °· Most people can return to normal activities a few days or weeks after the procedure. Ask your health care provider what activities are safe for you. °Eating and drinking °· Drink enough fluid to keep your urine clear or pale yellow. °· Eat a healthy, balanced diet. This includes lean proteins, whole grains, and plenty of fruits and vegetables. °General instructions °· Take over-the-counter and prescription medicines only as told by your health care provider. °· Keep all follow-up visits as told by your health care provider. This is important. You may still need additional treatment. °· Do not take baths, swim, or use a hot tub until your health  care provider approves. Shower and wash the area behind the scrotum gently. °· Do not have sex for one week after the treatment, or until your health care provider approves. °· If you have permanent, low-dose brachytherapy implants: °? Limit close contact with children and pregnant women for 2 months or as told by your health care provider. This is important because of the radiation that is still active in the prostate. °? You may set off radioactive sensors, such as airport screenings. Ask your health care provider for a document that explains your treatment. °? You may be instructed to use a condom during sex for the first 2 months after low-dose brachytherapy. °Contact a health care provider if: °· You have a fever or chills. °· You do not have a bowel movement for 3-4 days after the procedure. °· You have diarrhea for 3-4 days after the procedure. °· You develop any new symptoms, such as problems with urinating or erectile dysfunction. °· You have abdomen (abdominal) pain. °· You have more blood in your urine. °Get help right away if: °· You cannot urinate. °· There is excessive bleeding from your rectum. °· You have unusual drainage coming from your rectum. °· You have severe pain in the treated area that does not go away with pain medicine. °· You have severe nausea or vomiting. °Summary °· If you have permanent, low-dose brachytherapy implants, limit close contact with children and pregnant women for 2 months or as told by your health care provider. This is important because of the radiation   that is still active in the prostate.  Talk with your health care provider about your risk of brachytherapy side effects, such as erectile dysfunction or urinary problems. Your health care provider will be able to recommend possible treatment options.  Keep all follow-up visits as told by your health care provider. This is important. You may need additional treatment. This information is not intended to replace  advice given to you by your health care provider. Make sure you discuss any questions you have with your health care provider. Document Released: 05/22/2010 Document Revised: 04/01/2017 Document Reviewed: 05/21/2016 Elsevier Patient Education  [employer] Instructions  Activity: Get plenty of rest for the remainder of the day. A responsible individual must stay with you for 24 hours following the procedure.  For the next 24 hours, DO NOT: -Drive a car -Paediatric nurse -Drink alcoholic beverages -Take any medication unless instructed by your physician -Make any legal decisions or sign important papers.  Meals: Start with liquid foods such as gelatin or soup. Progress to regular foods as tolerated. Avoid greasy, spicy, heavy foods. If nausea and/or vomiting occur, drink only clear liquids until the nausea and/or vomiting subsides. Call your physician if vomiting continues.  Special Instructions/Symptoms: Your throat may feel dry or sore from the anesthesia or the breathing tube placed in your throat during surgery. If this causes discomfort, gargle with warm salt water. The discomfort should disappear within 24 hours.   Do not take any nonsteroidal anti inflammatories until after 6:30 pm today. Marland Kitchen

## 2018-12-15 ENCOUNTER — Encounter (HOSPITAL_BASED_OUTPATIENT_CLINIC_OR_DEPARTMENT_OTHER): Payer: Self-pay | Admitting: Urology

## 2018-12-15 NOTE — Progress Notes (Signed)
  Radiation Oncology         [phone] ________________________________  Name: Patrick Bauer MRN: [id number]  Date: 12/15/2018  DOB: 1960-05-17       Prostate Seed Implant  CC:Lam, Rudi Rummage, NP  No ref. provider found  DIAGNOSIS:  58 y.o. gentleman with Stage T1c adenocarcinoma of the prostate with Gleason score of 3+4, and PSA of 6.1     ICD-10-CM   1. Prostate cancer ([place])  C61 DG Chest 2 View    DG Chest 2 View    PROCEDURE: Insertion of radioactive I-125 seeds into the prostate gland.  RADIATION DOSE: 145 Gy, definitive therapy.  TECHNIQUE: [name] was brought to the operating room with the urologist. He was placed in the dorsolithotomy position. He was catheterized and a rectal tube was inserted. The perineum was shaved, prepped and draped. The ultrasound probe was then introduced into the rectum to see the prostate gland.  TREATMENT DEVICE: A needle grid was attached to the ultrasound probe stand and anchor needles were placed.  3D PLANNING: The prostate was imaged in 3D using a sagittal sweep of the prostate probe. These images were transferred to the planning computer. There, the prostate, urethra and rectum were defined on each axial reconstructed image. Then, the software created an optimized 3D plan and a few seed positions were adjusted. The quality of the plan was reviewed using [facility] information for the target and the following two organs at risk:  Urethra and Rectum.  Then the accepted plan was printed and handed off to the radiation therapist.  Under my supervision, the custom loading of the seeds and spacers was carried out and loaded into sealed vicryl sleeves.  These pre-loaded needles were then placed into the needle holder.Marland Kitchen  PROSTATE VOLUME STUDY:  Using transrectal ultrasound the volume of the prostate was verified to be 29.6 cc.  SPECIAL TREATMENT PROCEDURE/SUPERVISION AND HANDLING: The pre-loaded needles were then delivered under sagittal guidance. A total  of 30 needles were used to deposit 72 seeds in the prostate gland. The individual seed activity was 0.351 mCi.  SpaceOAR:  Yes  COMPLEX SIMULATION: At the end of the procedure, an anterior radiograph of the pelvis was obtained to document seed positioning and count. Cystoscopy was performed to check the urethra and bladder.  MICRODOSIMETRY: At the end of the procedure, the patient was emitting 0.07 mR/hr at 1 meter. Accordingly, he was considered safe for hospital discharge.  PLAN: The patient will return to the radiation oncology clinic for post implant CT dosimetry in three weeks.   ________________________________  Sheral Apley Tammi Klippel, M.D.

## 2018-12-29 DIAGNOSIS — M545 Low back pain: Secondary | ICD-10-CM | POA: Diagnosis not present

## 2018-12-29 DIAGNOSIS — Z1331 Encounter for screening for depression: Secondary | ICD-10-CM | POA: Diagnosis not present

## 2018-12-29 DIAGNOSIS — Z6830 Body mass index (BMI) 30.0-30.9, adult: Secondary | ICD-10-CM | POA: Diagnosis not present

## 2018-12-29 DIAGNOSIS — M7062 Trochanteric bursitis, left hip: Secondary | ICD-10-CM | POA: Diagnosis not present

## 2018-12-29 DIAGNOSIS — Z79899 Other long term (current) drug therapy: Secondary | ICD-10-CM | POA: Diagnosis not present

## 2018-12-29 DIAGNOSIS — C61 Malignant neoplasm of prostate: Secondary | ICD-10-CM | POA: Diagnosis not present

## 2019-01-02 ENCOUNTER — Telehealth: Payer: Self-pay | Admitting: *Deleted

## 2019-01-02 NOTE — Telephone Encounter (Signed)
Called patient to remind of post seed appts., line busy

## 2019-01-02 NOTE — Telephone Encounter (Signed)
Called patient to remind of post seed appts. for 01-03-19, spoke with patient and he is aware of these appts.

## 2019-01-03 ENCOUNTER — Telehealth: Payer: Self-pay | Admitting: *Deleted

## 2019-01-03 ENCOUNTER — Other Ambulatory Visit: Payer: Self-pay | Admitting: Urology

## 2019-01-03 ENCOUNTER — Ambulatory Visit: Payer: BC Managed Care – PPO | Admitting: Urology

## 2019-01-03 ENCOUNTER — Ambulatory Visit: Payer: BC Managed Care – PPO | Admitting: Radiation Oncology

## 2019-01-03 DIAGNOSIS — C61 Malignant neoplasm of prostate: Secondary | ICD-10-CM

## 2019-01-03 NOTE — Telephone Encounter (Signed)
CALLED PATIENT TO INFORM NOT TO COME TODAY, DUE TO NEEDING AN MRI, SPOKE WITH PATIENT AND HE KNOWS THAT I WILL CALL HIM BACK ONCE I HAVE IT SCHEDULED, PATIENT VERIFIED UNDERSTANDING THIS

## 2019-01-04 ENCOUNTER — Telehealth: Payer: Self-pay | Admitting: *Deleted

## 2019-01-04 DIAGNOSIS — R351 Nocturia: Secondary | ICD-10-CM | POA: Diagnosis not present

## 2019-01-04 NOTE — Telephone Encounter (Signed)
CALLED PATIENT TO INFORM OF POST SEED APPTS FOR 01/10/19 AND MRI FOR 01/11/19, NO ANSWER WILL CALL LATER

## 2019-01-04 NOTE — Telephone Encounter (Signed)
CALLED PATIENT TO INFORM OF POST SEED APPTS. BEING ARRANGED FOR 01-10-19 - ARRIVAL TIME- 10:15 AM @ [facility] ON 01-11-19 - ARRIVAL TIME- 8:30 AM @ [facility], NO RESTRICTIONS TO THE TEST, SPOKE WITH PATIENT AND HE IS AWARE OF THESE APPTS.

## 2019-01-09 NOTE — Progress Notes (Signed)
  Radiation Oncology         [phone] ________________________________  Name: Patrick Bauer MRN: [id number]  Date: 01/10/2019  DOB: 05-Jan-1961  COMPLEX SIMULATION NOTE  NARRATIVE:  The patient was brought to the [facility] today following prostate seed implantation approximately one month ago.  Identity was confirmed.  All relevant records and images related to the planned course of therapy were reviewed.  Then, the patient was set-up supine.  CT images were obtained.  The CT images were loaded into the planning software.  Then the prostate and rectum were contoured.  Treatment planning then occurred.  The implanted iodine 125 seeds were identified by the physics staff for projection of radiation distribution  I have requested : 3D Simulation  I have requested a DVH of the following structures: Prostate and rectum.    ________________________________  Sheral Apley Tammi Klippel, M.D.

## 2019-01-10 ENCOUNTER — Ambulatory Visit
Admission: RE | Admit: 2019-01-10 | Discharge: 2019-01-10 | Disposition: A | Discharge status: Home / Self Care | Payer: BC Managed Care – PPO | Source: Ambulatory Visit | Attending: Radiation Oncology | Admitting: Radiation Oncology

## 2019-01-10 ENCOUNTER — Ambulatory Visit
Admission: RE | Admit: 2019-01-10 | Discharge: 2019-01-10 | Disposition: A | Discharge status: Home / Self Care | Payer: BC Managed Care – PPO | Source: Ambulatory Visit | Attending: Urology | Admitting: Urology

## 2019-01-10 ENCOUNTER — Other Ambulatory Visit: Payer: Self-pay

## 2019-01-10 ENCOUNTER — Encounter: Payer: Self-pay | Admitting: Urology

## 2019-01-10 VITALS — BP 108/87 | HR 91 | Temp 98.3°F | Resp 18 | Wt 210.0 lb

## 2019-01-10 DIAGNOSIS — R3911 Hesitancy of micturition: Secondary | ICD-10-CM | POA: Insufficient documentation

## 2019-01-10 DIAGNOSIS — R350 Frequency of micturition: Secondary | ICD-10-CM | POA: Diagnosis not present

## 2019-01-10 DIAGNOSIS — C61 Malignant neoplasm of prostate: Secondary | ICD-10-CM | POA: Insufficient documentation

## 2019-01-10 DIAGNOSIS — Z923 Personal history of irradiation: Secondary | ICD-10-CM | POA: Insufficient documentation

## 2019-01-10 DIAGNOSIS — R3912 Poor urinary stream: Secondary | ICD-10-CM | POA: Insufficient documentation

## 2019-01-10 DIAGNOSIS — R351 Nocturia: Secondary | ICD-10-CM | POA: Insufficient documentation

## 2019-01-10 NOTE — Progress Notes (Signed)
Patrick Bauer is here for a post seed follow-up appointment. Reports some dysuria.Denies any hematuria.Reports that his stream is strong to moderate. Patient has a MRI schedule for tomorrow.Patient states that he has some leakage and urgency. Patient states that he see his urologist in December. Vitals:   01/10/19 1117  BP: 108/87  Pulse: 91  Resp: 18  Temp: 98.3 F (36.8 C)  TempSrc: Oral  SpO2: 100%  Weight: 210 lb (95.3 kg)

## 2019-01-10 NOTE — Progress Notes (Signed)
Radiation Oncology         [phone] ________________________________  Name: Patrick Bauer MRN: [id number]  Date: 01/10/2019  DOB: [birth year]  Post-Seed Follow-Up Visit Note  CC: Philmore Pali, NP  Irine Seal, MD  Diagnosis:   58 y.o. gentleman with Stage T1c adenocarcinoma of the prostate with Gleason score of 3+4, and PSA of 6.1     ICD-10-CM   1. Prostate cancer ([place])  C61     Interval Since Last Radiation:  3.5 weeks 12/14/18:  Insertion of radioactive I-125 seeds into the prostate gland; 145 Gy, definitive/boost therapy with placement of SpaceOAR gel.  Narrative:  The patient returns today for routine follow-up.  He is complaining of increased urinary frequency and urinary hesitation symptoms. He filled out a questionnaire regarding urinary function today providing and overall IPSS score of 22 characterizing his symptoms as severe with increased frequency, urgency, weak stream and nocturia x5/night.  He also reports dysuria at the beginning of his stream but denies gross hematuria, fever or chills. He was recently started on Flomax at the time of his recent f/u visit with Azucena Fallen, NP last week and reports that this has been quite helpful in alleviating his LUTS.  He feels that he empties his bladder well on voiding and denies any abdominal pain or bowel symptoms. He reports a healthy appetite and is maintaining his weight.  Overall, he is quite pleased with his progress to date.  ALLERGIES:  is allergic to bee venom and tylenol [acetaminophen].  Meds: Current Outpatient Medications  Medication Sig Dispense Refill  . calcium carbonate (TUMS - DOSED IN MG ELEMENTAL CALCIUM) 500 MG chewable tablet Chew 1 tablet by mouth as needed for indigestion or heartburn.    . loratadine-pseudoephedrine (CLARITIN-D 12-HOUR) 5-120 MG tablet Take 1 tablet by mouth as needed for allergies.    . Multiple Vitamins-Minerals (MULTIVITAMIN ADULT PO) Take by mouth.    . Saw Palmetto 1000 MG CAPS Take  by mouth.     No current facility-administered medications for this visit.     Physical Findings: In general this is a well appearing Caucasian male in no acute distress. He's alert and oriented x4 and appropriate throughout the examination. Cardiopulmonary assessment is negative for acute distress and he exhibits normal effort.   Lab Findings: Lab Results  Component Value Date   WBC 6.3 12/11/2018   HGB 13.9 12/11/2018   HCT 43.2 12/11/2018   MCV 81.2 12/11/2018   PLT 209 12/11/2018    Radiographic Findings:  Patient underwent CT imaging in our clinic for post implant dosimetry. The CT will be reviewed by Dr. Tammi Klippel to confirm there is an adequate distribution of radioactive seeds throughout the prostate gland and ensure that there are no seeds in or near the rectum. His scheduled for prostate MRI at 9am on Thursday 01/11/19 and those images will be fused with his CT images for further evaluation. We suspect the final radiation plan and dosimetry will show appropriate coverage of the prostate gland. He understands that we will call and inform him of any unexpected findings on further review of his imaging and dosimetry.  Impression/Plan: 58 y.o. gentleman with Stage T1c adenocarcinoma of the prostate with Gleason score of 3+4, and PSA of 6.1  The patient is recovering from the effects of radiation. His urinary symptoms should gradually improve over the next 4-6 months. We talked about this today. He will continue taking Flomax daily as prescribed and is encouraged by his improvement already  and is otherwise pleased with his outcome. We also talked about long-term follow-up for prostate cancer following seed implant. He understands that ongoing PSA determinations and digital rectal exams will help perform surveillance to rule out disease recurrence. He has a follow up appointment scheduled with Dr. Jeffie Pollock in 03/2019. He understands what to expect with his PSA measures. Patient was also educated  today about some of the long-term effects from radiation including a small risk for rectal bleeding and possibly erectile dysfunction. We talked about some of the general management approaches to these potential complications. However, I did encourage the patient to contact our office or return at any point if he has questions or concerns related to his previous radiation and prostate cancer.    Nicholos Johns, PA-C

## 2019-01-11 ENCOUNTER — Ambulatory Visit (HOSPITAL_COMMUNITY)
Admission: RE | Admit: 2019-01-11 | Discharge: 2019-01-11 | Disposition: A | Discharge status: Home / Self Care | Payer: BC Managed Care – PPO | Source: Ambulatory Visit | Attending: Urology | Admitting: Urology

## 2019-01-11 DIAGNOSIS — C61 Malignant neoplasm of prostate: Secondary | ICD-10-CM | POA: Diagnosis not present

## 2019-01-17 ENCOUNTER — Encounter: Payer: Self-pay | Admitting: Radiation Oncology

## 2019-01-17 DIAGNOSIS — C61 Malignant neoplasm of prostate: Secondary | ICD-10-CM | POA: Diagnosis not present

## 2019-01-21 NOTE — Progress Notes (Signed)
  Radiation Oncology         [phone] ________________________________  Name: [name] MRN: [id number]  Date: 01/17/2019  DOB: 30-Jul-1960  3D Planning Note   Prostate Brachytherapy Post-Implant Dosimetry  Diagnosis: 58 y.o. gentleman with Stage T1c adenocarcinoma of the prostate with Gleason score of 3+4, and PSA of 6.1.  Narrative: On a previous date, [name] returned following prostate seed implantation for post implant planning. He underwent CT scan complex simulation to delineate the three-dimensional structures of the pelvis and demonstrate the radiation distribution.  Since that time, the seed localization, and complex isodose planning with dose volume histograms have now been completed.  Results:   Prostate Coverage - The dose of radiation delivered to the 90% or more of the prostate gland (D90) was 99.43% of the prescription dose. This exceeds our goal of greater than 90%. Rectal Sparing - The volume of rectal tissue receiving the prescription dose or higher was 0.03 cc. This falls under our thresholds tolerance of 1.0 cc.  Impression: The prostate seed implant appears to show adequate target coverage and appropriate rectal sparing.  Plan:  The patient will continue to follow with urology for ongoing PSA determinations. I would anticipate a high likelihood for local tumor control with minimal risk for rectal morbidity.  ________________________________  Sheral Apley Tammi Klippel, M.D.

## 2019-03-26 DIAGNOSIS — Z23 Encounter for immunization: Secondary | ICD-10-CM | POA: Diagnosis not present

## 2019-03-26 DIAGNOSIS — M545 Low back pain: Secondary | ICD-10-CM | POA: Diagnosis not present

## 2019-03-26 DIAGNOSIS — Z923 Personal history of irradiation: Secondary | ICD-10-CM | POA: Diagnosis not present

## 2019-03-26 DIAGNOSIS — C61 Malignant neoplasm of prostate: Secondary | ICD-10-CM | POA: Diagnosis not present

## 2019-03-26 DIAGNOSIS — Z79899 Other long term (current) drug therapy: Secondary | ICD-10-CM | POA: Diagnosis not present

## 2019-03-26 DIAGNOSIS — M19012 Primary osteoarthritis, left shoulder: Secondary | ICD-10-CM | POA: Diagnosis not present

## 2019-04-02 DIAGNOSIS — C61 Malignant neoplasm of prostate: Secondary | ICD-10-CM | POA: Diagnosis not present

## 2019-04-09 DIAGNOSIS — C61 Malignant neoplasm of prostate: Secondary | ICD-10-CM | POA: Diagnosis not present

## 2019-04-09 DIAGNOSIS — N5201 Erectile dysfunction due to arterial insufficiency: Secondary | ICD-10-CM | POA: Diagnosis not present

## 2019-04-09 DIAGNOSIS — N401 Enlarged prostate with lower urinary tract symptoms: Secondary | ICD-10-CM | POA: Diagnosis not present

## 2019-04-09 DIAGNOSIS — R351 Nocturia: Secondary | ICD-10-CM | POA: Diagnosis not present

## 2019-05-03 DIAGNOSIS — R000 Tachycardia, unspecified: Secondary | ICD-10-CM | POA: Diagnosis not present

## 2019-05-03 DIAGNOSIS — M1990 Unspecified osteoarthritis, unspecified site: Secondary | ICD-10-CM | POA: Diagnosis not present

## 2019-05-03 DIAGNOSIS — Z20828 Contact with and (suspected) exposure to other viral communicable diseases: Secondary | ICD-10-CM | POA: Diagnosis not present

## 2019-05-03 DIAGNOSIS — M549 Dorsalgia, unspecified: Secondary | ICD-10-CM | POA: Diagnosis not present

## 2019-05-03 DIAGNOSIS — G8929 Other chronic pain: Secondary | ICD-10-CM | POA: Diagnosis not present

## 2019-05-03 DIAGNOSIS — R0789 Other chest pain: Secondary | ICD-10-CM | POA: Diagnosis not present

## 2019-05-03 DIAGNOSIS — R091 Pleurisy: Secondary | ICD-10-CM | POA: Diagnosis not present

## 2019-05-03 DIAGNOSIS — I10 Essential (primary) hypertension: Secondary | ICD-10-CM | POA: Diagnosis not present

## 2019-05-03 DIAGNOSIS — R0602 Shortness of breath: Secondary | ICD-10-CM | POA: Diagnosis not present

## 2019-05-03 DIAGNOSIS — R0781 Pleurodynia: Secondary | ICD-10-CM | POA: Diagnosis not present

## 2019-05-05 DIAGNOSIS — R079 Chest pain, unspecified: Secondary | ICD-10-CM | POA: Diagnosis not present

## 2019-05-05 DIAGNOSIS — J189 Pneumonia, unspecified organism: Secondary | ICD-10-CM | POA: Diagnosis not present

## 2019-05-05 DIAGNOSIS — R0602 Shortness of breath: Secondary | ICD-10-CM | POA: Diagnosis not present

## 2019-05-05 DIAGNOSIS — Z8546 Personal history of malignant neoplasm of prostate: Secondary | ICD-10-CM | POA: Diagnosis not present

## 2019-05-05 DIAGNOSIS — Z6829 Body mass index (BMI) 29.0-29.9, adult: Secondary | ICD-10-CM | POA: Diagnosis not present

## 2019-05-05 DIAGNOSIS — R0781 Pleurodynia: Secondary | ICD-10-CM | POA: Diagnosis not present

## 2019-05-08 DIAGNOSIS — R0781 Pleurodynia: Secondary | ICD-10-CM | POA: Diagnosis not present

## 2019-05-08 DIAGNOSIS — Z923 Personal history of irradiation: Secondary | ICD-10-CM | POA: Diagnosis not present

## 2019-05-08 DIAGNOSIS — R0602 Shortness of breath: Secondary | ICD-10-CM | POA: Diagnosis not present

## 2019-05-08 DIAGNOSIS — Z20822 Contact with and (suspected) exposure to covid-19: Secondary | ICD-10-CM | POA: Diagnosis not present

## 2019-05-08 DIAGNOSIS — E871 Hypo-osmolality and hyponatremia: Secondary | ICD-10-CM | POA: Diagnosis not present

## 2019-05-08 DIAGNOSIS — J439 Emphysema, unspecified: Secondary | ICD-10-CM | POA: Diagnosis not present

## 2019-05-08 DIAGNOSIS — M19041 Primary osteoarthritis, right hand: Secondary | ICD-10-CM | POA: Diagnosis not present

## 2019-05-08 DIAGNOSIS — J9819 Other pulmonary collapse: Secondary | ICD-10-CM | POA: Diagnosis not present

## 2019-05-08 DIAGNOSIS — E0965 Drug or chemical induced diabetes mellitus with hyperglycemia: Secondary | ICD-10-CM | POA: Diagnosis not present

## 2019-05-08 DIAGNOSIS — R1012 Left upper quadrant pain: Secondary | ICD-10-CM | POA: Diagnosis not present

## 2019-05-08 DIAGNOSIS — G8929 Other chronic pain: Secondary | ICD-10-CM | POA: Diagnosis not present

## 2019-05-08 DIAGNOSIS — R0789 Other chest pain: Secondary | ICD-10-CM | POA: Diagnosis not present

## 2019-05-08 DIAGNOSIS — Z87891 Personal history of nicotine dependence: Secondary | ICD-10-CM | POA: Diagnosis not present

## 2019-05-08 DIAGNOSIS — Z8739 Personal history of other diseases of the musculoskeletal system and connective tissue: Secondary | ICD-10-CM | POA: Diagnosis not present

## 2019-05-08 DIAGNOSIS — T8182XA Emphysema (subcutaneous) resulting from a procedure, initial encounter: Secondary | ICD-10-CM | POA: Diagnosis not present

## 2019-05-08 DIAGNOSIS — K760 Fatty (change of) liver, not elsewhere classified: Secondary | ICD-10-CM | POA: Diagnosis not present

## 2019-05-08 DIAGNOSIS — Z8669 Personal history of other diseases of the nervous system and sense organs: Secondary | ICD-10-CM | POA: Diagnosis not present

## 2019-05-08 DIAGNOSIS — G8918 Other acute postprocedural pain: Secondary | ICD-10-CM | POA: Diagnosis not present

## 2019-05-08 DIAGNOSIS — M19042 Primary osteoarthritis, left hand: Secondary | ICD-10-CM | POA: Diagnosis not present

## 2019-05-08 DIAGNOSIS — J90 Pleural effusion, not elsewhere classified: Secondary | ICD-10-CM | POA: Diagnosis not present

## 2019-05-08 DIAGNOSIS — J159 Unspecified bacterial pneumonia: Secondary | ICD-10-CM | POA: Diagnosis not present

## 2019-05-08 DIAGNOSIS — R079 Chest pain, unspecified: Secondary | ICD-10-CM | POA: Diagnosis not present

## 2019-05-08 DIAGNOSIS — J869 Pyothorax without fistula: Secondary | ICD-10-CM | POA: Diagnosis not present

## 2019-05-09 DIAGNOSIS — G8929 Other chronic pain: Secondary | ICD-10-CM | POA: Diagnosis not present

## 2019-05-09 DIAGNOSIS — Z902 Acquired absence of lung [part of]: Secondary | ICD-10-CM | POA: Diagnosis not present

## 2019-05-09 DIAGNOSIS — E1165 Type 2 diabetes mellitus with hyperglycemia: Secondary | ICD-10-CM | POA: Diagnosis not present

## 2019-05-09 DIAGNOSIS — R000 Tachycardia, unspecified: Secondary | ICD-10-CM | POA: Diagnosis not present

## 2019-05-09 DIAGNOSIS — J159 Unspecified bacterial pneumonia: Secondary | ICD-10-CM | POA: Diagnosis not present

## 2019-05-09 DIAGNOSIS — Z452 Encounter for adjustment and management of vascular access device: Secondary | ICD-10-CM | POA: Diagnosis not present

## 2019-05-09 DIAGNOSIS — Z87891 Personal history of nicotine dependence: Secondary | ICD-10-CM | POA: Diagnosis not present

## 2019-05-09 DIAGNOSIS — Z923 Personal history of irradiation: Secondary | ICD-10-CM | POA: Diagnosis not present

## 2019-05-09 DIAGNOSIS — Z8669 Personal history of other diseases of the nervous system and sense organs: Secondary | ICD-10-CM | POA: Diagnosis not present

## 2019-05-09 DIAGNOSIS — R509 Fever, unspecified: Secondary | ICD-10-CM | POA: Diagnosis not present

## 2019-05-09 DIAGNOSIS — Z20822 Contact with and (suspected) exposure to covid-19: Secondary | ICD-10-CM | POA: Diagnosis not present

## 2019-05-09 DIAGNOSIS — J9811 Atelectasis: Secondary | ICD-10-CM | POA: Diagnosis not present

## 2019-05-09 DIAGNOSIS — M19041 Primary osteoarthritis, right hand: Secondary | ICD-10-CM | POA: Diagnosis not present

## 2019-05-09 DIAGNOSIS — J90 Pleural effusion, not elsewhere classified: Secondary | ICD-10-CM | POA: Diagnosis not present

## 2019-05-09 DIAGNOSIS — E119 Type 2 diabetes mellitus without complications: Secondary | ICD-10-CM | POA: Diagnosis not present

## 2019-05-09 DIAGNOSIS — R0602 Shortness of breath: Secondary | ICD-10-CM | POA: Diagnosis not present

## 2019-05-09 DIAGNOSIS — R739 Hyperglycemia, unspecified: Secondary | ICD-10-CM | POA: Diagnosis not present

## 2019-05-09 DIAGNOSIS — G8918 Other acute postprocedural pain: Secondary | ICD-10-CM | POA: Diagnosis not present

## 2019-05-09 DIAGNOSIS — K760 Fatty (change of) liver, not elsewhere classified: Secondary | ICD-10-CM | POA: Diagnosis not present

## 2019-05-09 DIAGNOSIS — J9819 Other pulmonary collapse: Secondary | ICD-10-CM | POA: Diagnosis not present

## 2019-05-09 DIAGNOSIS — C61 Malignant neoplasm of prostate: Secondary | ICD-10-CM | POA: Diagnosis not present

## 2019-05-09 DIAGNOSIS — J869 Pyothorax without fistula: Secondary | ICD-10-CM | POA: Diagnosis not present

## 2019-05-09 DIAGNOSIS — R091 Pleurisy: Secondary | ICD-10-CM | POA: Diagnosis not present

## 2019-05-09 DIAGNOSIS — R918 Other nonspecific abnormal finding of lung field: Secondary | ICD-10-CM | POA: Diagnosis not present

## 2019-05-09 DIAGNOSIS — M19042 Primary osteoarthritis, left hand: Secondary | ICD-10-CM | POA: Diagnosis not present

## 2019-05-09 DIAGNOSIS — T797XXA Traumatic subcutaneous emphysema, initial encounter: Secondary | ICD-10-CM | POA: Diagnosis not present

## 2019-05-09 DIAGNOSIS — R079 Chest pain, unspecified: Secondary | ICD-10-CM | POA: Diagnosis not present

## 2019-05-09 DIAGNOSIS — J939 Pneumothorax, unspecified: Secondary | ICD-10-CM | POA: Diagnosis not present

## 2019-05-09 DIAGNOSIS — T8182XA Emphysema (subcutaneous) resulting from a procedure, initial encounter: Secondary | ICD-10-CM | POA: Diagnosis not present

## 2019-05-09 DIAGNOSIS — J189 Pneumonia, unspecified organism: Secondary | ICD-10-CM | POA: Diagnosis not present

## 2019-05-09 DIAGNOSIS — R0781 Pleurodynia: Secondary | ICD-10-CM | POA: Diagnosis not present

## 2019-05-09 DIAGNOSIS — E871 Hypo-osmolality and hyponatremia: Secondary | ICD-10-CM | POA: Diagnosis not present

## 2019-05-09 DIAGNOSIS — R0789 Other chest pain: Secondary | ICD-10-CM | POA: Diagnosis not present

## 2019-05-09 DIAGNOSIS — J949 Pleural condition, unspecified: Secondary | ICD-10-CM | POA: Diagnosis not present

## 2019-05-09 DIAGNOSIS — B954 Other streptococcus as the cause of diseases classified elsewhere: Secondary | ICD-10-CM | POA: Diagnosis not present

## 2019-05-09 DIAGNOSIS — J9809 Other diseases of bronchus, not elsewhere classified: Secondary | ICD-10-CM | POA: Diagnosis not present

## 2019-05-09 DIAGNOSIS — B9689 Other specified bacterial agents as the cause of diseases classified elsewhere: Secondary | ICD-10-CM | POA: Diagnosis not present

## 2019-05-09 DIAGNOSIS — Z8739 Personal history of other diseases of the musculoskeletal system and connective tissue: Secondary | ICD-10-CM | POA: Diagnosis not present

## 2019-05-09 DIAGNOSIS — J918 Pleural effusion in other conditions classified elsewhere: Secondary | ICD-10-CM | POA: Diagnosis not present

## 2019-05-09 DIAGNOSIS — Z4682 Encounter for fitting and adjustment of non-vascular catheter: Secondary | ICD-10-CM | POA: Diagnosis not present

## 2019-05-09 DIAGNOSIS — E0965 Drug or chemical induced diabetes mellitus with hyperglycemia: Secondary | ICD-10-CM | POA: Diagnosis not present

## 2019-05-09 DIAGNOSIS — R1012 Left upper quadrant pain: Secondary | ICD-10-CM | POA: Diagnosis not present

## 2019-05-10 DIAGNOSIS — J90 Pleural effusion, not elsewhere classified: Secondary | ICD-10-CM | POA: Diagnosis not present

## 2019-05-10 DIAGNOSIS — E871 Hypo-osmolality and hyponatremia: Secondary | ICD-10-CM | POA: Diagnosis not present

## 2019-05-10 DIAGNOSIS — Z4682 Encounter for fitting and adjustment of non-vascular catheter: Secondary | ICD-10-CM | POA: Diagnosis not present

## 2019-05-10 DIAGNOSIS — J189 Pneumonia, unspecified organism: Secondary | ICD-10-CM | POA: Diagnosis not present

## 2019-05-10 DIAGNOSIS — E119 Type 2 diabetes mellitus without complications: Secondary | ICD-10-CM | POA: Diagnosis not present

## 2019-05-11 DIAGNOSIS — E871 Hypo-osmolality and hyponatremia: Secondary | ICD-10-CM | POA: Diagnosis not present

## 2019-05-11 DIAGNOSIS — R0602 Shortness of breath: Secondary | ICD-10-CM | POA: Diagnosis not present

## 2019-05-11 DIAGNOSIS — E119 Type 2 diabetes mellitus without complications: Secondary | ICD-10-CM | POA: Diagnosis not present

## 2019-05-11 DIAGNOSIS — J189 Pneumonia, unspecified organism: Secondary | ICD-10-CM | POA: Diagnosis not present

## 2019-05-11 DIAGNOSIS — R079 Chest pain, unspecified: Secondary | ICD-10-CM | POA: Diagnosis not present

## 2019-05-11 DIAGNOSIS — J90 Pleural effusion, not elsewhere classified: Secondary | ICD-10-CM | POA: Diagnosis not present

## 2019-05-16 DIAGNOSIS — G8918 Other acute postprocedural pain: Secondary | ICD-10-CM | POA: Diagnosis not present

## 2019-05-16 DIAGNOSIS — R918 Other nonspecific abnormal finding of lung field: Secondary | ICD-10-CM | POA: Diagnosis not present

## 2019-05-16 DIAGNOSIS — J90 Pleural effusion, not elsewhere classified: Secondary | ICD-10-CM | POA: Diagnosis not present

## 2019-05-16 DIAGNOSIS — E119 Type 2 diabetes mellitus without complications: Secondary | ICD-10-CM | POA: Diagnosis not present

## 2019-05-16 DIAGNOSIS — J939 Pneumothorax, unspecified: Secondary | ICD-10-CM | POA: Diagnosis not present

## 2019-05-16 DIAGNOSIS — Z902 Acquired absence of lung [part of]: Secondary | ICD-10-CM | POA: Diagnosis not present

## 2019-05-16 DIAGNOSIS — T797XXA Traumatic subcutaneous emphysema, initial encounter: Secondary | ICD-10-CM | POA: Diagnosis not present

## 2019-05-16 DIAGNOSIS — R0789 Other chest pain: Secondary | ICD-10-CM | POA: Diagnosis not present

## 2019-05-19 DIAGNOSIS — Z902 Acquired absence of lung [part of]: Secondary | ICD-10-CM | POA: Diagnosis not present

## 2019-05-19 DIAGNOSIS — R0789 Other chest pain: Secondary | ICD-10-CM | POA: Diagnosis not present

## 2019-05-19 DIAGNOSIS — G8918 Other acute postprocedural pain: Secondary | ICD-10-CM | POA: Diagnosis not present

## 2019-05-19 DIAGNOSIS — J869 Pyothorax without fistula: Secondary | ICD-10-CM | POA: Diagnosis not present

## 2019-05-19 DIAGNOSIS — Z452 Encounter for adjustment and management of vascular access device: Secondary | ICD-10-CM | POA: Diagnosis not present

## 2019-05-19 DIAGNOSIS — R918 Other nonspecific abnormal finding of lung field: Secondary | ICD-10-CM | POA: Diagnosis not present

## 2019-05-19 DIAGNOSIS — J90 Pleural effusion, not elsewhere classified: Secondary | ICD-10-CM | POA: Diagnosis not present

## 2019-06-05 DIAGNOSIS — J90 Pleural effusion, not elsewhere classified: Secondary | ICD-10-CM | POA: Diagnosis not present

## 2019-06-26 DIAGNOSIS — Z923 Personal history of irradiation: Secondary | ICD-10-CM | POA: Diagnosis not present

## 2019-06-26 DIAGNOSIS — E1165 Type 2 diabetes mellitus with hyperglycemia: Secondary | ICD-10-CM | POA: Diagnosis not present

## 2019-06-26 DIAGNOSIS — C61 Malignant neoplasm of prostate: Secondary | ICD-10-CM | POA: Diagnosis not present

## 2019-06-26 DIAGNOSIS — M545 Low back pain: Secondary | ICD-10-CM | POA: Diagnosis not present

## 2019-06-26 DIAGNOSIS — Z79899 Other long term (current) drug therapy: Secondary | ICD-10-CM | POA: Diagnosis not present

## 2019-07-02 DIAGNOSIS — C61 Malignant neoplasm of prostate: Secondary | ICD-10-CM | POA: Diagnosis not present

## 2019-07-09 DIAGNOSIS — R351 Nocturia: Secondary | ICD-10-CM | POA: Diagnosis not present

## 2019-07-09 DIAGNOSIS — Z8546 Personal history of malignant neoplasm of prostate: Secondary | ICD-10-CM | POA: Diagnosis not present

## 2019-07-09 DIAGNOSIS — N5201 Erectile dysfunction due to arterial insufficiency: Secondary | ICD-10-CM | POA: Diagnosis not present

## 2019-07-09 DIAGNOSIS — N401 Enlarged prostate with lower urinary tract symptoms: Secondary | ICD-10-CM | POA: Diagnosis not present

## 2019-09-27 DIAGNOSIS — C61 Malignant neoplasm of prostate: Secondary | ICD-10-CM | POA: Diagnosis not present

## 2019-09-27 DIAGNOSIS — M545 Low back pain: Secondary | ICD-10-CM | POA: Diagnosis not present

## 2019-09-27 DIAGNOSIS — E1165 Type 2 diabetes mellitus with hyperglycemia: Secondary | ICD-10-CM | POA: Diagnosis not present

## 2019-09-27 DIAGNOSIS — Z79899 Other long term (current) drug therapy: Secondary | ICD-10-CM | POA: Diagnosis not present

## 2019-11-07 DIAGNOSIS — N401 Enlarged prostate with lower urinary tract symptoms: Secondary | ICD-10-CM | POA: Diagnosis not present

## 2019-11-07 DIAGNOSIS — R351 Nocturia: Secondary | ICD-10-CM | POA: Diagnosis not present

## 2019-11-14 DIAGNOSIS — R351 Nocturia: Secondary | ICD-10-CM | POA: Diagnosis not present

## 2019-11-14 DIAGNOSIS — R3912 Poor urinary stream: Secondary | ICD-10-CM | POA: Diagnosis not present

## 2019-11-14 DIAGNOSIS — Z8546 Personal history of malignant neoplasm of prostate: Secondary | ICD-10-CM | POA: Diagnosis not present

## 2019-11-14 DIAGNOSIS — N401 Enlarged prostate with lower urinary tract symptoms: Secondary | ICD-10-CM | POA: Diagnosis not present

## 2019-12-21 DIAGNOSIS — E1165 Type 2 diabetes mellitus with hyperglycemia: Secondary | ICD-10-CM | POA: Diagnosis not present

## 2019-12-21 DIAGNOSIS — M545 Low back pain: Secondary | ICD-10-CM | POA: Diagnosis not present

## 2019-12-21 DIAGNOSIS — C61 Malignant neoplasm of prostate: Secondary | ICD-10-CM | POA: Diagnosis not present

## 2019-12-21 DIAGNOSIS — Z79899 Other long term (current) drug therapy: Secondary | ICD-10-CM | POA: Diagnosis not present

## 2019-12-21 DIAGNOSIS — M19012 Primary osteoarthritis, left shoulder: Secondary | ICD-10-CM | POA: Diagnosis not present

## 2020-01-14 DIAGNOSIS — Z20822 Contact with and (suspected) exposure to covid-19: Secondary | ICD-10-CM | POA: Diagnosis not present

## 2020-03-14 DIAGNOSIS — H5202 Hypermetropia, left eye: Secondary | ICD-10-CM | POA: Diagnosis not present

## 2020-03-18 DIAGNOSIS — M545 Low back pain, unspecified: Secondary | ICD-10-CM | POA: Diagnosis not present

## 2020-03-18 DIAGNOSIS — Z79899 Other long term (current) drug therapy: Secondary | ICD-10-CM | POA: Diagnosis not present

## 2020-03-18 DIAGNOSIS — Z1331 Encounter for screening for depression: Secondary | ICD-10-CM | POA: Diagnosis not present

## 2020-03-18 DIAGNOSIS — C61 Malignant neoplasm of prostate: Secondary | ICD-10-CM | POA: Diagnosis not present

## 2020-03-18 DIAGNOSIS — M19012 Primary osteoarthritis, left shoulder: Secondary | ICD-10-CM | POA: Diagnosis not present

## 2020-03-18 DIAGNOSIS — E1165 Type 2 diabetes mellitus with hyperglycemia: Secondary | ICD-10-CM | POA: Diagnosis not present

## 2020-04-28 DIAGNOSIS — B9689 Other specified bacterial agents as the cause of diseases classified elsewhere: Secondary | ICD-10-CM | POA: Diagnosis not present

## 2020-04-28 DIAGNOSIS — J208 Acute bronchitis due to other specified organisms: Secondary | ICD-10-CM | POA: Diagnosis not present

## 2021-06-02 IMAGING — CR CHEST - 2 VIEW
2 series · 2 of 2 positions shown · non-contrast
Comparison: PA and lateral chest 02/23/2018.

CLINICAL DATA: Preoperative examination. Patient for brachytherapy
for prostate cancer.

EXAM:
CHEST - 2 VIEW

[w chest pa]
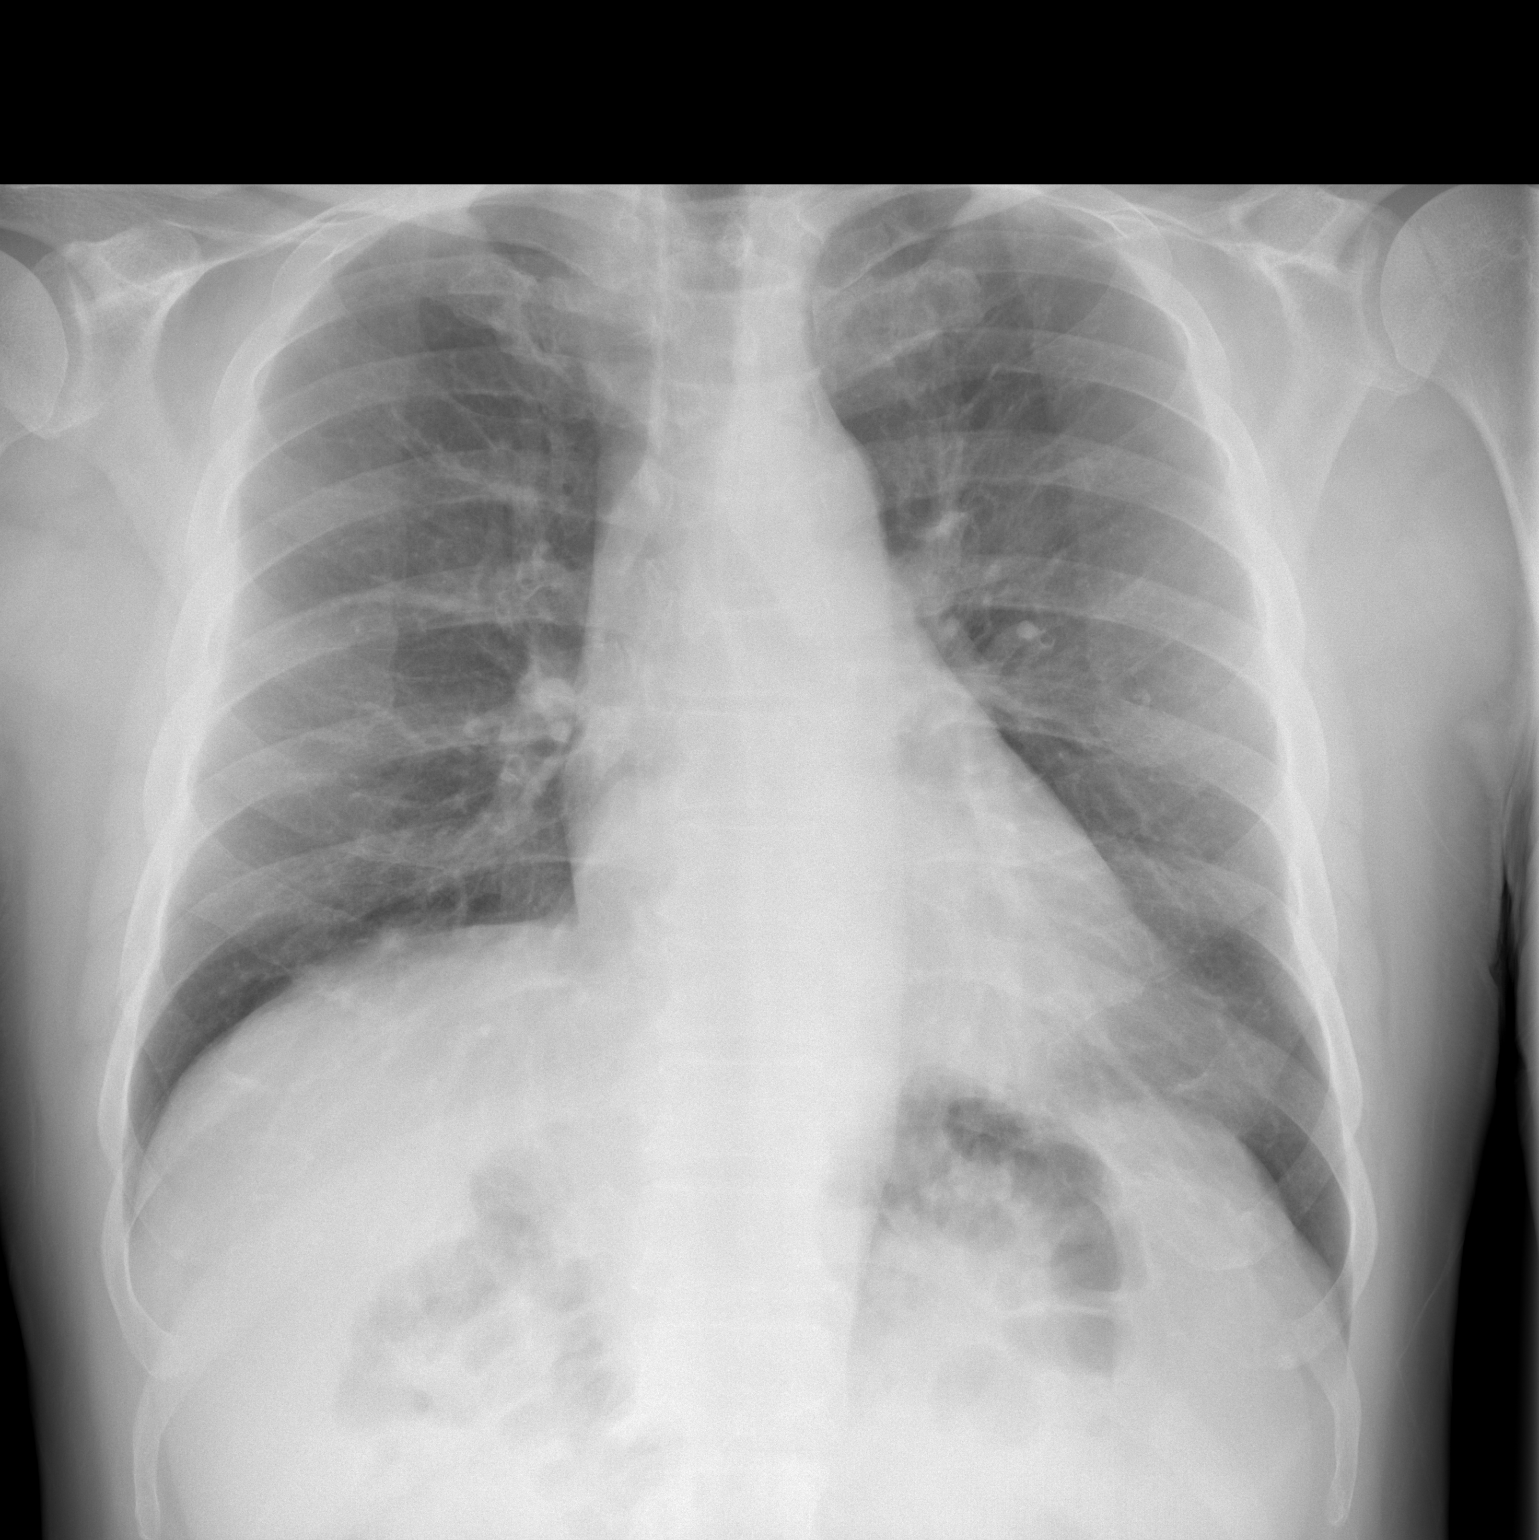

[w chest lat]
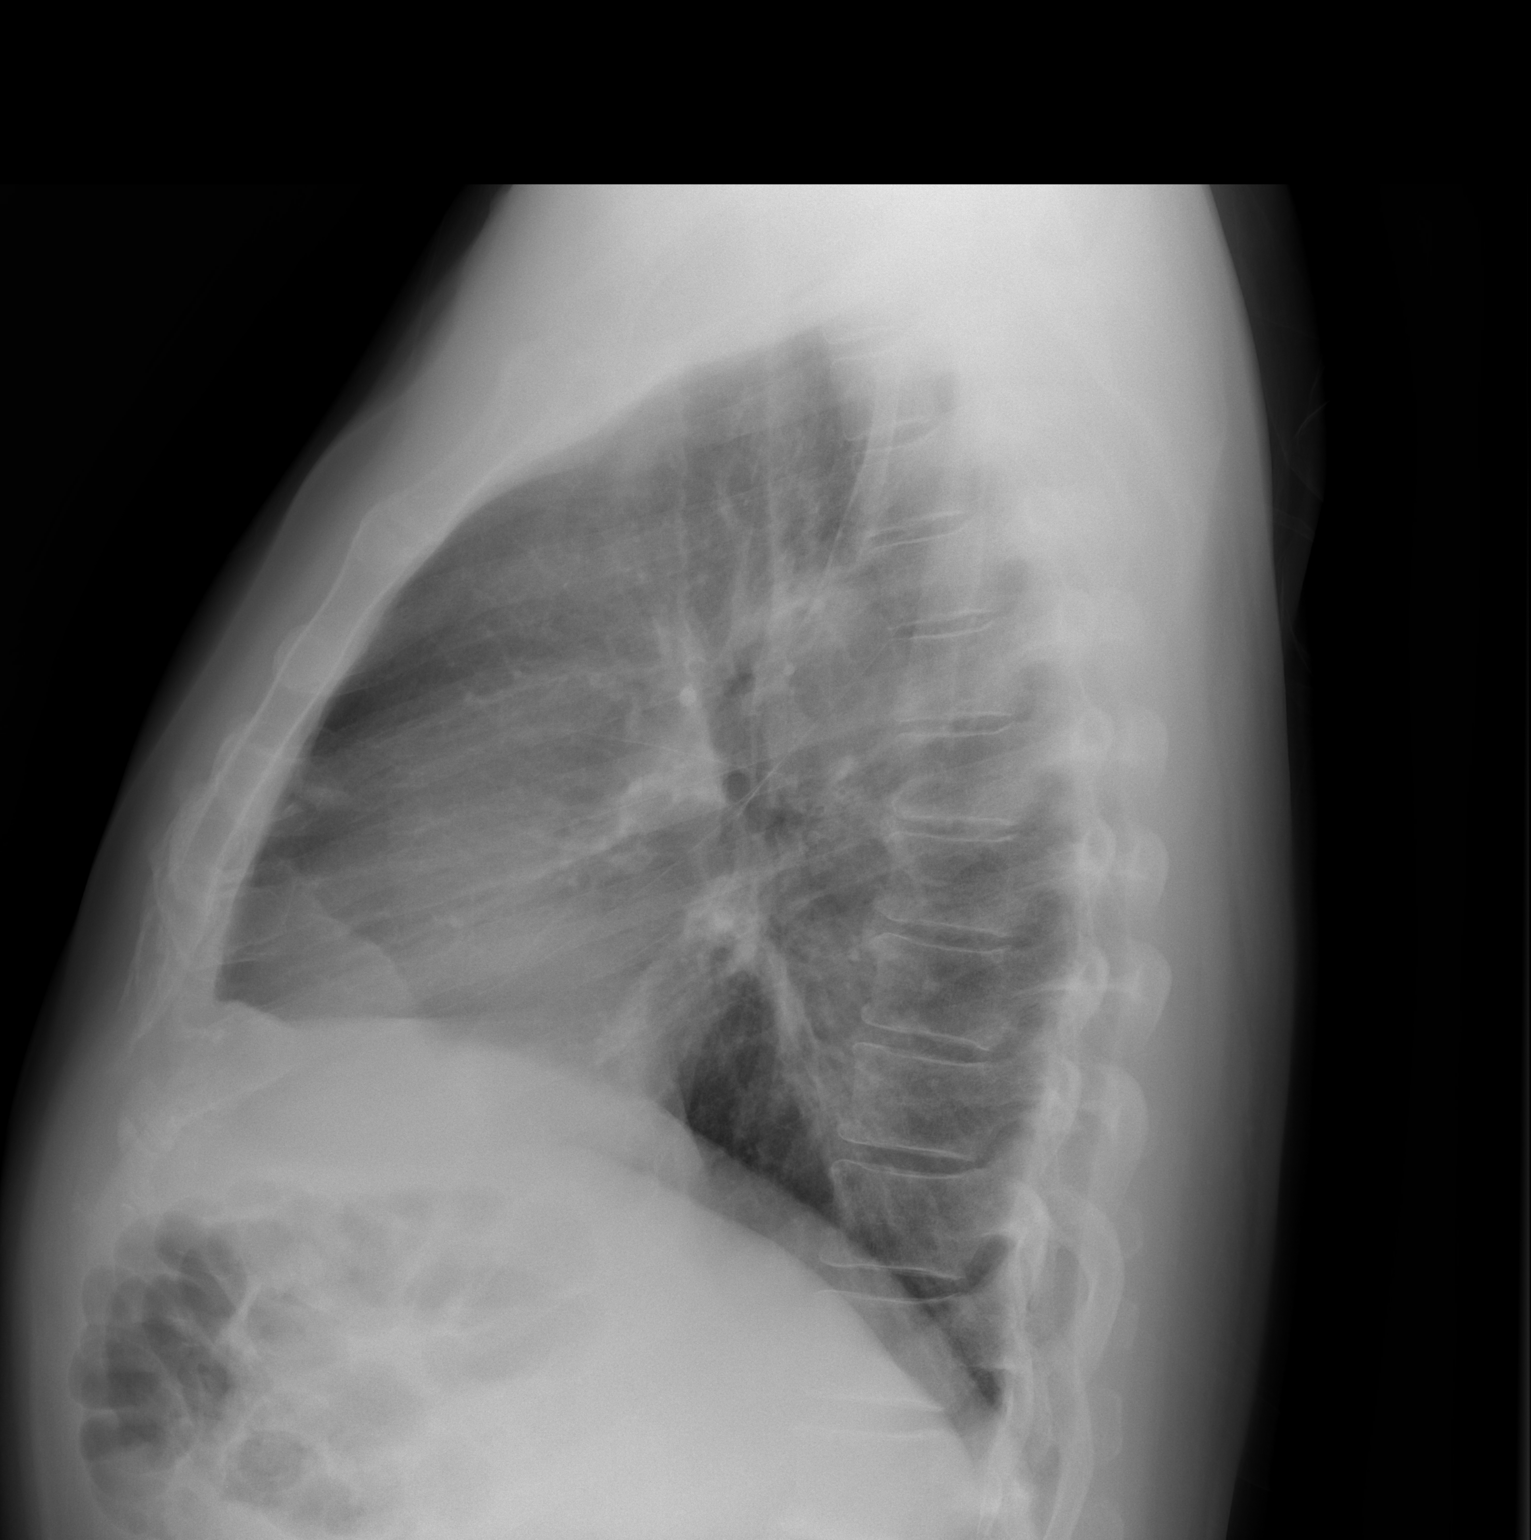

[2 of 2 positions shown; findings below may reference images not displayed]

FINDINGS: Lungs are clear. Heart size is normal. No pneumothorax or pleural
fluid. No bony abnormality.
IMPRESSION: Negative chest.

## 2021-10-01 DEATH — deceased
